# Patient Record
Sex: Female | Born: 1970 | Race: White | Hispanic: No | Marital: Married | Smoking: Never smoker
Health system: Southern US, Community
[De-identification: ages and names within clinical notes are randomized; demographics above are authoritative.]

## PROBLEM LIST (undated history)

## (undated) DIAGNOSIS — R87629 Unspecified abnormal cytological findings in specimens from vagina: Secondary | ICD-10-CM

## (undated) DIAGNOSIS — K219 Gastro-esophageal reflux disease without esophagitis: Secondary | ICD-10-CM

## (undated) DIAGNOSIS — E079 Disorder of thyroid, unspecified: Secondary | ICD-10-CM

## (undated) DIAGNOSIS — T7840XA Allergy, unspecified, initial encounter: Secondary | ICD-10-CM

## (undated) DIAGNOSIS — E7800 Pure hypercholesterolemia, unspecified: Secondary | ICD-10-CM

## (undated) DIAGNOSIS — F419 Anxiety disorder, unspecified: Secondary | ICD-10-CM

## (undated) DIAGNOSIS — E559 Vitamin D deficiency, unspecified: Secondary | ICD-10-CM

## (undated) HISTORY — DX: Allergy, unspecified, initial encounter: T78.40XA

## (undated) HISTORY — DX: Disorder of thyroid, unspecified: E07.9

## (undated) HISTORY — DX: Gastro-esophageal reflux disease without esophagitis: K21.9

## (undated) HISTORY — DX: Unspecified abnormal cytological findings in specimens from vagina: R87.629

## (undated) HISTORY — DX: Pure hypercholesterolemia, unspecified: E78.00

## (undated) HISTORY — DX: Vitamin D deficiency, unspecified: E55.9

## (undated) HISTORY — DX: Anxiety disorder, unspecified: F41.9

## (undated) HISTORY — PX: WISDOM TOOTH EXTRACTION: SHX21

---

## 1998-10-11 DIAGNOSIS — R87629 Unspecified abnormal cytological findings in specimens from vagina: Secondary | ICD-10-CM

## 1998-10-11 HISTORY — DX: Unspecified abnormal cytological findings in specimens from vagina: R87.629

## 2015-03-26 ENCOUNTER — Other Ambulatory Visit: Payer: Self-pay | Admitting: Obstetrics and Gynecology

## 2015-03-26 MED ORDER — NORGESTREL-ETHINYL ESTRADIOL 0.3-30 MG-MCG PO TABS: 1.0000 | ORAL_TABLET | Freq: Every day | ORAL | Status: DC

## 2015-03-26 NOTE — Telephone Encounter (Signed)
Please let her know a refill was sent in.

## 2015-03-26 NOTE — Telephone Encounter (Signed)
Notified pt rx sent in 

## 2015-03-27 NOTE — Telephone Encounter (Signed)
LM for pt letting her know. Stephanie Edwards

## 2015-04-16 ENCOUNTER — Encounter: Payer: Self-pay | Admitting: *Deleted

## 2015-04-17 ENCOUNTER — Encounter: Payer: Self-pay | Admitting: Obstetrics and Gynecology

## 2015-05-05 ENCOUNTER — Other Ambulatory Visit: Payer: Self-pay | Admitting: Obstetrics and Gynecology

## 2015-05-08 ENCOUNTER — Other Ambulatory Visit: Payer: Self-pay | Admitting: *Deleted

## 2015-05-08 ENCOUNTER — Ambulatory Visit (INDEPENDENT_AMBULATORY_CARE_PROVIDER_SITE_OTHER): Payer: BLUE CROSS/BLUE SHIELD | Admitting: Obstetrics and Gynecology

## 2015-05-08 ENCOUNTER — Encounter: Payer: Self-pay | Admitting: Obstetrics and Gynecology

## 2015-05-08 VITALS — BP 119/68 | HR 71 | Ht 67.0 in | Wt 182.6 lb

## 2015-05-08 DIAGNOSIS — E559 Vitamin D deficiency, unspecified: Secondary | ICD-10-CM

## 2015-05-08 DIAGNOSIS — E663 Overweight: Secondary | ICD-10-CM

## 2015-05-08 DIAGNOSIS — Z01419 Encounter for gynecological examination (general) (routine) without abnormal findings: Secondary | ICD-10-CM | POA: Diagnosis not present

## 2015-05-08 MED ORDER — NORGESTREL-ETHINYL ESTRADIOL 0.3-30 MG-MCG PO TABS: 1.0000 | ORAL_TABLET | Freq: Every day | ORAL | Status: DC

## 2015-05-08 MED ORDER — LEVOCETIRIZINE DIHYDROCHLORIDE 5 MG PO TABS: 5.0000 mg | ORAL_TABLET | Freq: Every day | ORAL | Status: DC

## 2015-05-08 NOTE — Progress Notes (Signed)
  Subjective:     Stephanie Edwards is a 44 y.o. female and is here for a comprehensive physical exam. The patient reports no problems.  History   Social History  . Marital Status: Married    Spouse Name: N/A  . Number of Children: N/A  . Years of Education: N/A   Occupational History  . Not on file.   Social History Main Topics  . Smoking status: Never Smoker   . Smokeless tobacco: Never Used  . Alcohol Use: Yes     Comment: occas  . Drug Use: No  . Sexual Activity: Yes    Birth Control/ Protection: Pill   Other Topics Concern  . Not on file   Social History Narrative   Health Maintenance  Topic Date Due  . HIV Screening  02/26/1987  . PAP SMEAR  02/25/1990  . TETANUS/TDAP  02/26/1991  . INFLUENZA VACCINE  05/12/2015    The following portions of the patient's history were reviewed and updated as appropriate: allergies, current medications, past family history, past medical history, past social history, past surgical history and problem list.  Review of Systems A comprehensive review of systems was negative.   Objective:    General appearance: alert, cooperative and appears stated age Neck: no adenopathy, no carotid bruit, no JVD, supple, symmetrical, trachea midline and thyroid not enlarged, symmetric, no tenderness/mass/nodules Lungs: clear to auscultation bilaterally Breasts: normal appearance, no masses or tenderness Heart: regular rate and rhythm, S1, S2 normal, no murmur, click, rub or gallop Abdomen: soft, non-tender; bowel sounds normal; no masses,  no organomegaly Pelvic: cervix normal in appearance, external genitalia normal, no adnexal masses or tenderness, no cervical motion tenderness, rectovaginal septum normal, uterus normal size, shape, and consistency and vagina normal without discharge    Assessment:    Healthy female exam. OCP user; overweight      Plan:  Routine screening labs(to be done at work-order given); Routine MMG ordered Refills for  OCP and Xyzal given  RTC 1 year See After Visit Summary for Counseling Recommendations

## 2015-05-08 NOTE — Patient Instructions (Signed)
Thank you for enrolling in MyChart. Please follow the instructions below to securely access your online medical record. MyChart allows you to send messages to your doctor, view your test results, renew your prescriptions, schedule appointments, and more.  How Do I Sign Up? 1. In your Internet browser, go to [email or web address] 2. Click on the New  User? link in the Sign In box.  3. Enter your MyChart Access Code exactly as it appears below. You will not need to use this code after you have completed the sign-up process. If you do not sign up before the expiration date, you must request a new code. MyChart Access Code: [email or web address] Expires: 07/07/2015  9:29 AM  4. Enter the last four digits of your Social Security Number (xxxx) and Date of Birth (mm/dd/yyyy) as indicated and click Next. You will be taken to the next sign-up page. 5. Create a MyChart ID. This will be your MyChart login ID and cannot be changed, so think of one that is secure and easy to remember. 6. Create a MyChart password. You can change your password at any time. 7. Enter your Password Reset Question and Answer and click Next. This can be used at a later time if you forget your password.  8. Select your communication preference, and if applicable enter your e-mail address. You will receive e-mail notification when new information is available in MyChart by choosing to receive e-mail notifications and filling in your e-mail. 9. Click Sign In. You can now view your medical record.   Additional Information If you have questions, you can email [email or web address] or call [phone] to talk to our MyChart staff. Remember, MyChart is NOT to be used for urgent needs. For medical emergencies, dial 911.

## 2015-05-16 ENCOUNTER — Telehealth: Payer: Self-pay | Admitting: *Deleted

## 2015-05-16 ENCOUNTER — Other Ambulatory Visit: Payer: Self-pay | Admitting: Obstetrics and Gynecology

## 2015-05-16 ENCOUNTER — Ambulatory Visit
Admission: RE | Admit: 2015-05-16 | Discharge: 2015-05-16 | Disposition: A | Discharge status: Home / Self Care | Payer: BLUE CROSS/BLUE SHIELD | Source: Ambulatory Visit | Attending: Obstetrics and Gynecology | Admitting: Obstetrics and Gynecology

## 2015-05-16 DIAGNOSIS — Z01419 Encounter for gynecological examination (general) (routine) without abnormal findings: Secondary | ICD-10-CM | POA: Insufficient documentation

## 2015-05-16 DIAGNOSIS — Z1231 Encounter for screening mammogram for malignant neoplasm of breast: Secondary | ICD-10-CM | POA: Insufficient documentation

## 2015-05-16 NOTE — Telephone Encounter (Signed)
-----   Message from Ulyses Amor, [place] sent at 05/16/2015  9:57 AM EDT ----- Please let her know I have reviewed her MMG and it is normal, though she does have increased density throughout both breast.

## 2015-05-19 ENCOUNTER — Encounter: Payer: Self-pay | Admitting: Obstetrics and Gynecology

## 2015-05-20 ENCOUNTER — Other Ambulatory Visit: Payer: Self-pay | Admitting: Obstetrics and Gynecology

## 2015-05-20 DIAGNOSIS — R7989 Other specified abnormal findings of blood chemistry: Secondary | ICD-10-CM

## 2015-05-20 DIAGNOSIS — E559 Vitamin D deficiency, unspecified: Secondary | ICD-10-CM

## 2015-05-20 MED ORDER — VITAMIN D (ERGOCALCIFEROL) 1.25 MG (50000 UNIT) PO CAPS: 50000.0000 [IU] | ORAL_CAPSULE | ORAL | Status: DC

## 2015-06-06 ENCOUNTER — Telehealth: Payer: Self-pay | Admitting: *Deleted

## 2015-06-06 NOTE — Telephone Encounter (Signed)
-----   Message from Ulyses Amor, [place] sent at 05/20/2015  3:56 PM EDT ----- Please let her know i have reviewed her labs, and her vit D is too low- needs to be on weekly supplements- and i sent in Rx and pleas mail info on vi D def,   Also thyroid is not functioning well, and I want her to be evaluated by endocrinologist- I put order in

## 2015-06-06 NOTE — Telephone Encounter (Signed)
Notified pt of lab results, mailed info on Vit D Needs her referral done-thanks

## 2015-06-10 NOTE — Telephone Encounter (Signed)
Referral was already done. I called [facility] to check the status, they stated they have been unable to reach the patient by phone and mailed her a letter to contact there office. I also called patient and left message.

## 2015-08-26 ENCOUNTER — Other Ambulatory Visit: Payer: Self-pay | Admitting: Obstetrics and Gynecology

## 2015-11-25 ENCOUNTER — Other Ambulatory Visit: Payer: Self-pay | Admitting: Obstetrics and Gynecology

## 2016-05-05 ENCOUNTER — Other Ambulatory Visit: Payer: Self-pay | Admitting: Obstetrics and Gynecology

## 2016-05-05 MED ORDER — LEVOCETIRIZINE DIHYDROCHLORIDE 5 MG PO TABS: 5.0000 mg | ORAL_TABLET | Freq: Every day | ORAL | 0 refills | Status: DC

## 2016-05-05 MED ORDER — NORGESTREL-ETHINYL ESTRADIOL 0.3-30 MG-MCG PO TABS: 1.0000 | ORAL_TABLET | Freq: Every day | ORAL | 0 refills | Status: DC

## 2016-05-11 ENCOUNTER — Other Ambulatory Visit: Payer: Self-pay | Admitting: Obstetrics and Gynecology

## 2016-05-12 ENCOUNTER — Encounter: Payer: Self-pay | Admitting: Obstetrics and Gynecology

## 2016-05-12 ENCOUNTER — Ambulatory Visit (INDEPENDENT_AMBULATORY_CARE_PROVIDER_SITE_OTHER): Payer: BLUE CROSS/BLUE SHIELD | Admitting: Obstetrics and Gynecology

## 2016-05-12 ENCOUNTER — Encounter: Payer: BLUE CROSS/BLUE SHIELD | Admitting: Obstetrics and Gynecology

## 2016-05-12 VITALS — BP 104/62 | HR 71 | Ht 67.0 in | Wt 192.8 lb

## 2016-05-12 DIAGNOSIS — E559 Vitamin D deficiency, unspecified: Secondary | ICD-10-CM | POA: Diagnosis not present

## 2016-05-12 DIAGNOSIS — Z01419 Encounter for gynecological examination (general) (routine) without abnormal findings: Secondary | ICD-10-CM | POA: Diagnosis not present

## 2016-05-12 NOTE — Progress Notes (Signed)
Subjective:   Stephanie Edwards is a 45 y.o. G0P0000 Caucasian female here for a routine well-woman exam.  No LMP recorded.    Current complaints: weight gain (last year 183#), walking 3 days a week  PCP: me       does desire labs  Social History: Sexual: heterosexual Marital Status: married Living situation: with family Occupation: [employer] at [employer] abroad Tobacco/alcohol: no tobacco use Illicit drugs: no history of illicit drug use  The following portions of the patient's history were reviewed and updated as appropriate: allergies, current medications, past family history, past medical history, past social history, past surgical history and problem list.  Past Medical History Past Medical History:  Diagnosis Date  . High cholesterol   . Vaginal Pap smear, abnormal 2000  . Vitamin D deficiency     Past Surgical History History reviewed. No pertinent surgical history.  Gynecologic History G0P0000  No LMP recorded. Contraception: IUD Last Pap: 2016. Results were: normal Last mammogram: 2016. Results were: normal   Obstetric History OB History  Gravida Para Term Preterm AB Living  0 0 0 0 0 0  SAB TAB Ectopic Multiple Live Births  0 0 0 0 0        Current Medications Current Outpatient Prescriptions on File Prior to Visit  Medication Sig Dispense Refill  . levocetirizine (XYZAL) 5 MG tablet Take 1 tablet (5 mg total) by mouth daily. 90 tablet 4  . norgestrel-ethinyl estradiol (LOW-OGESTREL) 0.3-30 MG-MCG tablet Take 1 tablet by mouth daily. 3 Package 0  . Vitamin D, Ergocalciferol, (DRISDOL) 50000 UNITS CAPS capsule Take 1 capsule (50,000 Units total) by mouth every 7 (seven) days. 30 capsule 1   No current facility-administered medications on file prior to visit.     Review of Systems Patient denies any headaches, blurred vision, shortness of breath, chest pain, abdominal pain, problems with bowel movements, urination, or intercourse.  Objective:  BP 104/62   Pulse  71   Ht 5\' 7"  (1.702 m)   Wt 192 lb 12.8 oz (87.5 kg)   BMI 30.20 kg/m  Physical Exam  General:  Well developed, well nourished, no acute distress. She is alert and oriented x3. Skin:  Warm and dry Neck:  Midline trachea, no thyromegaly or nodules Cardiovascular: Regular rate and rhythm, no murmur heard Lungs:  Effort normal, all lung fields clear to auscultation bilaterally Breasts:  No dominant palpable mass, retraction, or nipple discharge Abdomen:  Soft, non tender, no hepatosplenomegaly or masses Pelvic:  External genitalia is normal in appearance.  The vagina is normal in appearance. The cervix is bulbous, no CMT.  Thin prep pap is not done. Uterus is felt to be normal size, shape, and contour.  No adnexal masses or tenderness noted. Extremities:  No swelling or varicosities noted Psych:  She has a normal mood and affect  Assessment:   Healthy well-woman exam Overweight Vit D deficiency H/o abnormal thyroid levels BC counseling  Plan:  Labs repeated Considering switching to [facility] F/U 1 year for AE Mammogram ordered Shamel Galyean Suzan Nailer, CNM

## 2016-05-12 NOTE — Patient Instructions (Signed)
Preventive Care for Adults, Female A healthy lifestyle and preventive care can promote health and wellness. Preventive health guidelines for women include the following key practices.  A routine yearly physical is a good way to check with your health care provider about your health and preventive screening. It is a chance to share any concerns and updates on your health and to receive a thorough exam.  Visit your dentist for a routine exam and preventive care every 6 months. Brush your teeth twice a day and floss once a day. Good oral hygiene prevents tooth decay and gum disease.  The frequency of eye exams is based on your age, health, family medical history, use of contact lenses, and other factors. Follow your health care provider's recommendations for frequency of eye exams.  Eat a healthy diet. Foods like vegetables, fruits, whole grains, low-fat dairy products, and lean protein foods contain the nutrients you need without too many calories. Decrease your intake of foods high in solid fats, added sugars, and salt. Eat the right amount of calories for you.Get information about a proper diet from your health care provider, if necessary.  Regular physical exercise is one of the most important things you can do for your health. Most adults should get at least 150 minutes of moderate-intensity exercise (any activity that increases your heart rate and causes you to sweat) each week. In addition, most adults need muscle-strengthening exercises on 2 or more days a week.  Maintain a healthy weight. The body mass index (BMI) is a screening tool to identify possible weight problems. It provides an estimate of body fat based on height and weight. Your health care provider can find your BMI and can help you achieve or maintain a healthy weight.For adults 20 years and older:  A BMI below 18.5 is considered underweight.  A BMI of 18.5 to 24.9 is normal.  A BMI of 25 to 29.9 is considered  overweight.  A BMI of 30 and above is considered obese.  Maintain normal blood lipids and cholesterol levels by exercising and minimizing your intake of saturated fat. Eat a balanced diet with plenty of fruit and vegetables. Blood tests for lipids and cholesterol should begin at age 64 and be repeated every 5 years. If your lipid or cholesterol levels are high, you are over 50, or you are at high risk for heart disease, you may need your cholesterol levels checked more frequently.Ongoing high lipid and cholesterol levels should be treated with medicines if diet and exercise are not working.  If you smoke, find out from your health care provider how to quit. If you do not use tobacco, do not start.  Lung cancer screening is recommended for adults aged 52-80 years who are at high risk for developing lung cancer because of a history of smoking. A yearly low-dose CT scan of the lungs is recommended for people who have at least a 30-pack-year history of smoking and are a current smoker or have quit within the past 15 years. A pack year of smoking is smoking an average of 1 pack of cigarettes a day for 1 year (for example: 1 pack a day for 30 years or 2 packs a day for 15 years). Yearly screening should continue until the smoker has stopped smoking for at least 15 years. Yearly screening should be stopped for people who develop a health problem that would prevent them from having lung cancer treatment.  If you are pregnant, do not drink alcohol. If you are  breastfeeding, be very cautious about drinking alcohol. If you are not pregnant and choose to drink alcohol, do not have more than 1 drink per day. One drink is considered to be 12 ounces (355 mL) of beer, 5 ounces (148 mL) of wine, or 1.5 ounces (44 mL) of liquor.  Avoid use of street drugs. Do not share needles with anyone. Ask for help if you need support or instructions about stopping the use of drugs.  High blood pressure causes heart disease and  increases the risk of stroke. Your blood pressure should be checked at least every 1 to 2 years. Ongoing high blood pressure should be treated with medicines if weight loss and exercise do not work.  If you are 25-78 years old, ask your health care provider if you should take aspirin to prevent strokes.  Diabetes screening is done by taking a blood sample to check your blood glucose level after you have not eaten for a certain period of time (fasting). If you are not overweight and you do not have risk factors for diabetes, you should be screened once every 3 years starting at age 86. If you are overweight or obese and you are 3-87 years of age, you should be screened for diabetes every year as part of your cardiovascular risk assessment.  Breast cancer screening is essential preventive care for women. You should practice "breast self-awareness." This means understanding the normal appearance and feel of your breasts and may include breast self-examination. Any changes detected, no matter how small, should be reported to a health care provider. Women in their 66s and 30s should have a clinical breast exam (CBE) by a health care provider as part of a regular health exam every 1 to 3 years. After age 43, women should have a CBE every year. Starting at age 37, women should consider having a mammogram (breast X-ray test) every year. Women who have a family history of breast cancer should talk to their health care provider about genetic screening. Women at a high risk of breast cancer should talk to their health care providers about having an MRI and a mammogram every year.  Breast cancer gene (BRCA)-related cancer risk assessment is recommended for women who have family members with BRCA-related cancers. BRCA-related cancers include breast, ovarian, tubal, and peritoneal cancers. Having family members with these cancers may be associated with an increased risk for harmful changes (mutations) in the breast  cancer genes BRCA1 and BRCA2. Results of the assessment will determine the need for genetic counseling and BRCA1 and BRCA2 testing.  Your health care provider may recommend that you be screened regularly for cancer of the pelvic organs (ovaries, uterus, and vagina). This screening involves a pelvic examination, including checking for microscopic changes to the surface of your cervix (Pap test). You may be encouraged to have this screening done every 3 years, beginning at age 78.  For women ages 79-65, health care providers may recommend pelvic exams and Pap testing every 3 years, or they may recommend the Pap and pelvic exam, combined with testing for human papilloma virus (HPV), every 5 years. Some types of HPV increase your risk of cervical cancer. Testing for HPV may also be done on women of any age with unclear Pap test results.  Other health care providers may not recommend any screening for nonpregnant women who are considered low risk for pelvic cancer and who do not have symptoms. Ask your health care provider if a screening pelvic exam is right for  you.  If you have had past treatment for cervical cancer or a condition that could lead to cancer, you need Pap tests and screening for cancer for at least 20 years after your treatment. If Pap tests have been discontinued, your risk factors (such as having a new sexual partner) need to be reassessed to determine if screening should resume. Some women have medical problems that increase the chance of getting cervical cancer. In these cases, your health care provider may recommend more frequent screening and Pap tests.  Colorectal cancer can be detected and often prevented. Most routine colorectal cancer screening begins at the age of 50 years and continues through age 75 years. However, your health care provider may recommend screening at an earlier age if you have risk factors for colon cancer. On a yearly basis, your health care provider may provide  home test kits to check for hidden blood in the stool. Use of a small camera at the end of a tube, to directly examine the colon (sigmoidoscopy or colonoscopy), can detect the earliest forms of colorectal cancer. Talk to your health care provider about this at age 50, when routine screening begins. Direct exam of the colon should be repeated every 5-10 years through age 75 years, unless early forms of precancerous polyps or small growths are found.  People who are at an increased risk for hepatitis B should be screened for this virus. You are considered at high risk for hepatitis B if:  You were born in a country where hepatitis B occurs often. Talk with your health care provider about which countries are considered high risk.  Your parents were born in a high-risk country and you have not received a shot to protect against hepatitis B (hepatitis B vaccine).  You have HIV or AIDS.  You use needles to inject street drugs.  You live with, or have sex with, someone who has hepatitis B.  You get hemodialysis treatment.  You take certain medicines for conditions like cancer, organ transplantation, and autoimmune conditions.  Hepatitis C blood testing is recommended for all people born from 1945 through 1965 and any individual with known risks for hepatitis C.  Practice safe sex. Use condoms and avoid high-risk sexual practices to reduce the spread of sexually transmitted infections (STIs). STIs include gonorrhea, chlamydia, syphilis, trichomonas, herpes, HPV, and human immunodeficiency virus (HIV). Herpes, HIV, and HPV are viral illnesses that have no cure. They can result in disability, cancer, and death.  You should be screened for sexually transmitted illnesses (STIs) including gonorrhea and chlamydia if:  You are sexually active and are younger than 24 years.  You are older than 24 years and your health care provider tells you that you are at risk for this type of infection.  Your sexual  activity has changed since you were last screened and you are at an increased risk for chlamydia or gonorrhea. Ask your health care provider if you are at risk.  If you are at risk of being infected with HIV, it is recommended that you take a prescription medicine daily to prevent HIV infection. This is called preexposure prophylaxis (PrEP). You are considered at risk if:  You are sexually active and do not regularly use condoms or know the HIV status of your partner(s).  You take drugs by injection.  You are sexually active with a partner who has HIV.  Talk with your health care provider about whether you are at high risk of being infected with HIV. If   you choose to begin PrEP, you should first be tested for HIV. You should then be tested every 3 months for as long as you are taking PrEP.  Osteoporosis is a disease in which the bones lose minerals and strength with aging. This can result in serious bone fractures or breaks. The risk of osteoporosis can be identified using a bone density scan. Women ages 1 years and over and women at risk for fractures or osteoporosis should discuss screening with their health care providers. Ask your health care provider whether you should take a calcium supplement or vitamin D to reduce the rate of osteoporosis.  Menopause can be associated with physical symptoms and risks. Hormone replacement therapy is available to decrease symptoms and risks. You should talk to your health care provider about whether hormone replacement therapy is right for you.  Use sunscreen. Apply sunscreen liberally and repeatedly throughout the day. You should seek shade when your shadow is shorter than you. Protect yourself by wearing long sleeves, pants, a wide-brimmed hat, and sunglasses year round, whenever you are outdoors.  Once a month, do a whole body skin exam, using a mirror to look at the skin on your back. Tell your health care provider of new moles, moles that have irregular  borders, moles that are larger than a pencil eraser, or moles that have changed in shape or color.  Stay current with required vaccines (immunizations).  Influenza vaccine. All adults should be immunized every year.  Tetanus, diphtheria, and acellular pertussis (Td, Tdap) vaccine. Pregnant women should receive 1 dose of Tdap vaccine during each pregnancy. The dose should be obtained regardless of the length of time since the last dose. Immunization is preferred during the 27th-36th week of gestation. An adult who has not previously received Tdap or who does not know her vaccine status should receive 1 dose of Tdap. This initial dose should be followed by tetanus and diphtheria toxoids (Td) booster doses every 10 years. Adults with an unknown or incomplete history of completing a 3-dose immunization series with Td-containing vaccines should begin or complete a primary immunization series including a Tdap dose. Adults should receive a Td booster every 10 years.  Varicella vaccine. An adult without evidence of immunity to varicella should receive 2 doses or a second dose if she has previously received 1 dose. Pregnant females who do not have evidence of immunity should receive the first dose after pregnancy. This first dose should be obtained before leaving the health care facility. The second dose should be obtained 4-8 weeks after the first dose.  Human papillomavirus (HPV) vaccine. Females aged 13-26 years who have not received the vaccine previously should obtain the 3-dose series. The vaccine is not recommended for use in pregnant females. However, pregnancy testing is not needed before receiving a dose. If a female is found to be pregnant after receiving a dose, no treatment is needed. In that case, the remaining doses should be delayed until after the pregnancy. Immunization is recommended for any person with an immunocompromised condition through the age of 24 years if she did not get any or all doses  earlier. During the 3-dose series, the second dose should be obtained 4-8 weeks after the first dose. The third dose should be obtained 24 weeks after the first dose and 16 weeks after the second dose.  Zoster vaccine. One dose is recommended for adults aged 97 years or older unless certain conditions are present.  Measles, mumps, and rubella (MMR) vaccine. Adults born  before 1957 generally are considered immune to measles and mumps. Adults born in [birth year] or later should have 1 or more doses of MMR vaccine unless there is a contraindication to the vaccine or there is laboratory evidence of immunity to each of the three diseases. A routine second dose of MMR vaccine should be obtained at least 28 days after the first dose for students attending postsecondary schools, health care workers, or international travelers. People who received inactivated measles vaccine or an unknown type of measles vaccine during 1963-1967 should receive 2 doses of MMR vaccine. People who received inactivated mumps vaccine or an unknown type of mumps vaccine before 1979 and are at high risk for mumps infection should consider immunization with 2 doses of MMR vaccine. For females of childbearing age, rubella immunity should be determined. If there is no evidence of immunity, females who are not pregnant should be vaccinated. If there is no evidence of immunity, females who are pregnant should delay immunization until after pregnancy. Unvaccinated health care workers born before 60 who lack laboratory evidence of measles, mumps, or rubella immunity or laboratory confirmation of disease should consider measles and mumps immunization with 2 doses of MMR vaccine or rubella immunization with 1 dose of MMR vaccine.  Pneumococcal 13-valent conjugate (PCV13) vaccine. When indicated, a person who is uncertain of his immunization history and has no record of immunization should receive the PCV13 vaccine. All adults 61 years of age and older  should receive this vaccine. An adult aged 92 years or older who has certain medical conditions and has not been previously immunized should receive 1 dose of PCV13 vaccine. This PCV13 should be followed with a dose of pneumococcal polysaccharide (PPSV23) vaccine. Adults who are at high risk for pneumococcal disease should obtain the PPSV23 vaccine at least 8 weeks after the dose of PCV13 vaccine. Adults older than 44 years of age who have normal immune system function should obtain the PPSV23 vaccine dose at least 1 year after the dose of PCV13 vaccine.  Pneumococcal polysaccharide (PPSV23) vaccine. When PCV13 is also indicated, PCV13 should be obtained first. All adults aged 2 years and older should be immunized. An adult younger than age 30 years who has certain medical conditions should be immunized. Any person who resides in a nursing home or long-term care facility should be immunized. An adult smoker should be immunized. People with an immunocompromised condition and certain other conditions should receive both PCV13 and PPSV23 vaccines. People with human immunodeficiency virus (HIV) infection should be immunized as soon as possible after diagnosis. Immunization during chemotherapy or radiation therapy should be avoided. Routine use of PPSV23 vaccine is not recommended for American Indians, [place] Natives, or people younger than 65 years unless there are medical conditions that require PPSV23 vaccine. When indicated, people who have unknown immunization and have no record of immunization should receive PPSV23 vaccine. One-time revaccination 5 years after the first dose of PPSV23 is recommended for people aged 19-64 years who have chronic kidney failure, nephrotic syndrome, asplenia, or immunocompromised conditions. People who received 1-2 doses of PPSV23 before age 44 years should receive another dose of PPSV23 vaccine at age 83 years or later if at least 5 years have passed since the previous dose. Doses  of PPSV23 are not needed for people immunized with PPSV23 at or after age 20 years.  Meningococcal vaccine. Adults with asplenia or persistent complement component deficiencies should receive 2 doses of quadrivalent meningococcal conjugate (MenACWY-D) vaccine. The doses should be obtained  at least 2 months apart. Microbiologists working with certain meningococcal bacteria, [place] recruits, people at risk during an outbreak, and people who travel to or live in countries with a high rate of meningitis should be immunized. A first-year college student up through age 28 years who is living in a residence hall should receive a dose if she did not receive a dose on or after her 16th birthday. Adults who have certain high-risk conditions should receive one or more doses of vaccine.  Hepatitis A vaccine. Adults who wish to be protected from this disease, have certain high-risk conditions, work with hepatitis A-infected animals, work in hepatitis A research labs, or travel to or work in countries with a high rate of hepatitis A should be immunized. Adults who were previously unvaccinated and who anticipate close contact with an international adoptee during the first 60 days after arrival in the [place] from a country with a high rate of hepatitis A should be immunized.  Hepatitis B vaccine. Adults who wish to be protected from this disease, have certain high-risk conditions, may be exposed to blood or other infectious body fluids, are household contacts or sex partners of hepatitis B positive people, are clients or workers in certain care facilities, or travel to or work in countries with a high rate of hepatitis B should be immunized.  Haemophilus influenzae type b (Hib) vaccine. A previously unvaccinated person with asplenia or sickle cell disease or having a scheduled splenectomy should receive 1 dose of Hib vaccine. Regardless of previous immunization, a recipient of a hematopoietic stem cell transplant  should receive a 3-dose series 6-12 months after her successful transplant. Hib vaccine is not recommended for adults with HIV infection. Preventive Services / Frequency Ages 71 to 87 years  Blood pressure check.** / Every 3-5 years.  Lipid and cholesterol check.** / Every 5 years beginning at age 1.  Clinical breast exam.** / Every 3 years for women in their 3s and 31s.  BRCA-related cancer risk assessment.** / For women who have family members with a BRCA-related cancer (breast, ovarian, tubal, or peritoneal cancers).  Pap test.** / Every 2 years from ages 50 through 86. Every 3 years starting at age 87 through age 7 or 75 with a history of 3 consecutive normal Pap tests.  HPV screening.** / Every 3 years from ages 59 through ages 35 to 6 with a history of 3 consecutive normal Pap tests.  Hepatitis C blood test.** / For any individual with known risks for hepatitis C.  Skin self-exam. / Monthly.  Influenza vaccine. / Every year.  Tetanus, diphtheria, and acellular pertussis (Tdap, Td) vaccine.** / Consult your health care provider. Pregnant women should receive 1 dose of Tdap vaccine during each pregnancy. 1 dose of Td every 10 years.  Varicella vaccine.** / Consult your health care provider. Pregnant females who do not have evidence of immunity should receive the first dose after pregnancy.  HPV vaccine. / 3 doses over 6 months, if 72 and younger. The vaccine is not recommended for use in pregnant females. However, pregnancy testing is not needed before receiving a dose.  Measles, mumps, rubella (MMR) vaccine.** / You need at least 1 dose of MMR if you were born in 1957 or later. You may also need a 2nd dose. For females of childbearing age, rubella immunity should be determined. If there is no evidence of immunity, females who are not pregnant should be vaccinated. If there is no evidence of immunity, females who are  pregnant should delay immunization until after  pregnancy.  Pneumococcal 13-valent conjugate (PCV13) vaccine.** / Consult your health care provider.  Pneumococcal polysaccharide (PPSV23) vaccine.** / 1 to 2 doses if you smoke cigarettes or if you have certain conditions.  Meningococcal vaccine.** / 1 dose if you are age 87 to 44 years and a Market researcher living in a residence hall, or have one of several medical conditions, you need to get vaccinated against meningococcal disease. You may also need additional booster doses.  Hepatitis A vaccine.** / Consult your health care provider.  Hepatitis B vaccine.** / Consult your health care provider.  Haemophilus influenzae type b (Hib) vaccine.** / Consult your health care provider. Ages 86 to 38 years  Blood pressure check.** / Every year.  Lipid and cholesterol check.** / Every 5 years beginning at age 49 years.  Lung cancer screening. / Every year if you are aged 71-80 years and have a 30-pack-year history of smoking and currently smoke or have quit within the past 15 years. Yearly screening is stopped once you have quit smoking for at least 15 years or develop a health problem that would prevent you from having lung cancer treatment.  Clinical breast exam.** / Every year after age 51 years.  BRCA-related cancer risk assessment.** / For women who have family members with a BRCA-related cancer (breast, ovarian, tubal, or peritoneal cancers).  Mammogram.** / Every year beginning at age 18 years and continuing for as long as you are in good health. Consult with your health care provider.  Pap test.** / Every 3 years starting at age 63 years through age 37 or 57 years with a history of 3 consecutive normal Pap tests.  HPV screening.** / Every 3 years from ages 41 years through ages 76 to 23 years with a history of 3 consecutive normal Pap tests.  Fecal occult blood test (FOBT) of stool. / Every year beginning at age 36 years and continuing until age 51 years. You may not need  to do this test if you get a colonoscopy every 10 years.  Flexible sigmoidoscopy or colonoscopy.** / Every 5 years for a flexible sigmoidoscopy or every 10 years for a colonoscopy beginning at age 36 years and continuing until age 35 years.  Hepatitis C blood test.** / For all people born from [birth year] through 1965 and any individual with known risks for hepatitis C.  Skin self-exam. / Monthly.  Influenza vaccine. / Every year.  Tetanus, diphtheria, and acellular pertussis (Tdap/Td) vaccine.** / Consult your health care provider. Pregnant women should receive 1 dose of Tdap vaccine during each pregnancy. 1 dose of Td every 10 years.  Varicella vaccine.** / Consult your health care provider. Pregnant females who do not have evidence of immunity should receive the first dose after pregnancy.  Zoster vaccine.** / 1 dose for adults aged 73 years or older.  Measles, mumps, rubella (MMR) vaccine.** / You need at least 1 dose of MMR if you were born in 1957 or later. You may also need a second dose. For females of childbearing age, rubella immunity should be determined. If there is no evidence of immunity, females who are not pregnant should be vaccinated. If there is no evidence of immunity, females who are pregnant should delay immunization until after pregnancy.  Pneumococcal 13-valent conjugate (PCV13) vaccine.** / Consult your health care provider.  Pneumococcal polysaccharide (PPSV23) vaccine.** / 1 to 2 doses if you smoke cigarettes or if you have certain conditions.  Meningococcal vaccine.** /  Consult your health care provider.  Hepatitis A vaccine.** / Consult your health care provider.  Hepatitis B vaccine.** / Consult your health care provider.  Haemophilus influenzae type b (Hib) vaccine.** / Consult your health care provider. Ages 80 years and over  Blood pressure check.** / Every year.  Lipid and cholesterol check.** / Every 5 years beginning at age 62 years.  Lung cancer  screening. / Every year if you are aged 32-80 years and have a 30-pack-year history of smoking and currently smoke or have quit within the past 15 years. Yearly screening is stopped once you have quit smoking for at least 15 years or develop a health problem that would prevent you from having lung cancer treatment.  Clinical breast exam.** / Every year after age 61 years.  BRCA-related cancer risk assessment.** / For women who have family members with a BRCA-related cancer (breast, ovarian, tubal, or peritoneal cancers).  Mammogram.** / Every year beginning at age 39 years and continuing for as long as you are in good health. Consult with your health care provider.  Pap test.** / Every 3 years starting at age 85 years through age 74 or 72 years with 3 consecutive normal Pap tests. Testing can be stopped between 65 and 70 years with 3 consecutive normal Pap tests and no abnormal Pap or HPV tests in the past 10 years.  HPV screening.** / Every 3 years from ages 55 years through ages 67 or 77 years with a history of 3 consecutive normal Pap tests. Testing can be stopped between 65 and 70 years with 3 consecutive normal Pap tests and no abnormal Pap or HPV tests in the past 10 years.  Fecal occult blood test (FOBT) of stool. / Every year beginning at age 81 years and continuing until age 22 years. You may not need to do this test if you get a colonoscopy every 10 years.  Flexible sigmoidoscopy or colonoscopy.** / Every 5 years for a flexible sigmoidoscopy or every 10 years for a colonoscopy beginning at age 67 years and continuing until age 22 years.  Hepatitis C blood test.** / For all people born from [birth year] through 1964 and any individual with known risks for hepatitis C.  Osteoporosis screening.** / A one-time screening for women ages 8 years and over and women at risk for fractures or osteoporosis.  Skin self-exam. / Monthly.  Influenza vaccine. / Every year.  Tetanus, diphtheria, and  acellular pertussis (Tdap/Td) vaccine.** / 1 dose of Td every 10 years.  Varicella vaccine.** / Consult your health care provider.  Zoster vaccine.** / 1 dose for adults aged 56 years or older.  Pneumococcal 13-valent conjugate (PCV13) vaccine.** / Consult your health care provider.  Pneumococcal polysaccharide (PPSV23) vaccine.** / 1 dose for all adults aged 15 years and older.  Meningococcal vaccine.** / Consult your health care provider.  Hepatitis A vaccine.** / Consult your health care provider.  Hepatitis B vaccine.** / Consult your health care provider.  Haemophilus influenzae type b (Hib) vaccine.** / Consult your health care provider. ** Family history and personal history of risk and conditions may change your health care provider's recommendations.   This information is not intended to replace advice given to you by your health care provider. Make sure you discuss any questions you have with your health care provider.   Document Released: 11/23/2001 Document Revised: 10/18/2014 Document Reviewed: 02/22/2011 Elsevier Interactive Patient Education [employer].

## 2016-05-14 MED ORDER — NORGESTREL-ETHINYL ESTRADIOL 0.3-30 MG-MCG PO TABS: 1.0000 | ORAL_TABLET | Freq: Every day | ORAL | 0 refills | Status: DC

## 2016-05-14 MED ORDER — LEVOCETIRIZINE DIHYDROCHLORIDE 5 MG PO TABS: 5.0000 mg | ORAL_TABLET | Freq: Every day | ORAL | 0 refills | Status: DC

## 2016-05-21 ENCOUNTER — Other Ambulatory Visit: Payer: Self-pay | Admitting: Obstetrics and Gynecology

## 2016-05-21 ENCOUNTER — Ambulatory Visit
Admission: RE | Admit: 2016-05-21 | Discharge: 2016-05-21 | Disposition: A | Discharge status: Home / Self Care | Payer: BLUE CROSS/BLUE SHIELD | Source: Ambulatory Visit | Attending: Obstetrics and Gynecology | Admitting: Obstetrics and Gynecology

## 2016-05-21 DIAGNOSIS — Z1231 Encounter for screening mammogram for malignant neoplasm of breast: Secondary | ICD-10-CM | POA: Diagnosis not present

## 2016-05-21 DIAGNOSIS — Z01419 Encounter for gynecological examination (general) (routine) without abnormal findings: Secondary | ICD-10-CM | POA: Diagnosis not present

## 2016-05-27 ENCOUNTER — Encounter: Payer: Self-pay | Admitting: Obstetrics and Gynecology

## 2016-06-11 ENCOUNTER — Encounter: Payer: Self-pay | Admitting: Obstetrics and Gynecology

## 2016-06-11 ENCOUNTER — Other Ambulatory Visit: Payer: Self-pay | Admitting: Obstetrics and Gynecology

## 2016-06-11 DIAGNOSIS — E559 Vitamin D deficiency, unspecified: Secondary | ICD-10-CM | POA: Diagnosis not present

## 2016-06-11 DIAGNOSIS — Z01419 Encounter for gynecological examination (general) (routine) without abnormal findings: Secondary | ICD-10-CM | POA: Diagnosis not present

## 2016-06-12 LAB — COMPREHENSIVE METABOLIC PANEL
A/G RATIO: 2 (ref 1.2–2.2)
ALBUMIN: 4.4 g/dL (ref 3.5–5.5)
ALT: 12 IU/L (ref 0–32)
AST: 17 IU/L (ref 0–40)
Alkaline Phosphatase: 72 IU/L (ref 39–117)
BUN/Creatinine Ratio: 11 (ref 9–23)
BUN: 11 mg/dL (ref 6–24)
Bilirubin Total: 0.5 mg/dL (ref 0.0–1.2)
CO2: 24 mmol/L (ref 18–29)
Calcium: 9.3 mg/dL (ref 8.7–10.2)
Chloride: 100 mmol/L (ref 96–106)
Creatinine, Ser: 1 mg/dL (ref 0.57–1.00)
GFR calc non Af Amer: 69 mL/min/{1.73_m2} (ref >59)
GFR, EST AFRICAN AMERICAN: 79 mL/min/{1.73_m2} (ref >59)
GLOBULIN, TOTAL: 2.2 g/dL (ref 1.5–4.5)
Glucose: 91 mg/dL (ref 65–99)
POTASSIUM: 5.2 mmol/L (ref 3.5–5.2)
SODIUM: 138 mmol/L (ref 134–144)
TOTAL PROTEIN: 6.6 g/dL (ref 6.0–8.5)

## 2016-06-12 LAB — CBC
Hematocrit: 41.1 % (ref 34.0–46.6)
Hemoglobin: 14.1 g/dL (ref 11.1–15.9)
MCH: 31.3 pg (ref 26.6–33.0)
MCHC: 34.3 g/dL (ref 31.5–35.7)
MCV: 91 fL (ref 79–97)
PLATELETS: 237 10*3/uL (ref 150–379)
RBC: 4.5 x10E6/uL (ref 3.77–5.28)
RDW: 12.9 % (ref 12.3–15.4)
WBC: 5.2 10*3/uL (ref 3.4–10.8)

## 2016-06-12 LAB — LIPID PANEL W/O CHOL/HDL RATIO
CHOLESTEROL TOTAL: 230 mg/dL — AB (ref 100–199)
HDL: 57 mg/dL (ref >39)
LDL CALC: 156 mg/dL — AB (ref 0–99)
Triglycerides: 87 mg/dL (ref 0–149)
VLDL Cholesterol Cal: 17 mg/dL (ref 5–40)

## 2016-06-12 LAB — THYROID PANEL WITH TSH
Free Thyroxine Index: 2.3 (ref 1.2–4.9)
T3 Uptake Ratio: 24 % (ref 24–39)
T4 TOTAL: 9.4 ug/dL (ref 4.5–12.0)
TSH: 4.33 u[IU]/mL (ref 0.450–4.500)

## 2016-06-12 LAB — VITAMIN D 25 HYDROXY (VIT D DEFICIENCY, FRACTURES): VIT D 25 HYDROXY: 29.4 ng/mL — AB (ref 30.0–100.0)

## 2016-06-24 ENCOUNTER — Other Ambulatory Visit: Payer: Self-pay | Admitting: *Deleted

## 2016-06-24 MED ORDER — VITAMIN D (ERGOCALCIFEROL) 1.25 MG (50000 UNIT) PO CAPS: 50000.0000 [IU] | ORAL_CAPSULE | ORAL | 4 refills | Status: DC

## 2016-06-27 ENCOUNTER — Encounter: Payer: Self-pay | Admitting: Obstetrics and Gynecology

## 2016-07-16 ENCOUNTER — Encounter: Payer: Self-pay | Admitting: Obstetrics and Gynecology

## 2016-07-28 ENCOUNTER — Other Ambulatory Visit: Payer: Self-pay | Admitting: Obstetrics and Gynecology

## 2016-07-28 MED ORDER — MISOPROSTOL 200 MCG PO TABS: 200.0000 ug | ORAL_TABLET | Freq: Once | ORAL | 0 refills | Status: DC

## 2016-07-29 ENCOUNTER — Encounter: Payer: Self-pay | Admitting: Obstetrics and Gynecology

## 2016-07-29 ENCOUNTER — Ambulatory Visit (INDEPENDENT_AMBULATORY_CARE_PROVIDER_SITE_OTHER): Payer: BLUE CROSS/BLUE SHIELD | Admitting: Obstetrics and Gynecology

## 2016-07-29 VITALS — BP 116/75 | HR 83 | Ht 67.0 in | Wt 195.2 lb

## 2016-07-29 DIAGNOSIS — Z30430 Encounter for insertion of intrauterine contraceptive device: Secondary | ICD-10-CM | POA: Diagnosis not present

## 2016-07-29 MED ORDER — LEVONORGESTREL 19.5 MG IU IUD: 1.0000 | INTRAUTERINE_SYSTEM | Freq: Once | INTRAUTERINE | 0 refills | Status: DC

## 2016-07-29 NOTE — Progress Notes (Signed)
Stephanie Edwards is a 45 y.o. year old 1000P0000 Caucasian female who presents for placement of a Kyleena IUD.  Patient's last menstrual period was 07/01/2016. BP 116/75   Pulse 83   Ht 5\' 7"  (1.702 m)   Wt 195 lb 3.2 oz (88.5 kg)   LMP 07/01/2016   BMI 30.57 kg/m  Last sexual intercourse was last week, and pregnancy test today was negative  The risks and benefits of the method and placement have been thouroughly reviewed with the patient and all questions were answered.  Specifically the patient is aware of failure rate of 10/998, expulsion of the IUD and of possible perforation.  The patient is aware of irregular bleeding due to the method and understands the incidence of irregular bleeding diminishes with time.  Signed copy of informed consent in chart.   Time out was performed.  A pederson speculum was placed in the vagina.  The cervix was visualized, prepped using Betadine, and grasped with a single tooth tenaculum. The uterus was found to be retroflexed and it sounded to 7 cm.  Kyeena IUD placed per manufacturer's recommendations.   The strings were trimmed to 3 cm.  The patient was given post procedure instructions, including signs and symptoms of infection and to check for the strings after each menses or each month, and refraining from intercourse or anything in the vagina for 3 days.  She was given a [place] care card with date [place] placed, and date to be removed.    Melody Suzan NailerN Shambley, CNM

## 2016-07-29 NOTE — Patient Instructions (Signed)
IUD PLACEMENT POST-PROCEDURE INSTRUCTIONS  1. You may take Ibuprofen, Aleve or Tylenol for pain if needed.  Cramping should resolve within in 24 hours.  2. You may have a small amount of spotting.  You should wear a mini pad for the next few days.  3. You may have intercourse after 24 hours.  If you using this for birth control, it is effective immediately.  4. You need to call if you have any pelvic pain, fever, heavy bleeding or foul smelling vaginal discharge.  Irregular bleeding is common the first several months after having an IUD placed. You do not need to call for this reason unless you are concerned.  5. Shower or bathe as normal  6. You should have a follow-up appointment in 8 weeks for a re-check to make sure you are not having any problems.

## 2016-09-23 ENCOUNTER — Encounter: Payer: Self-pay | Admitting: Obstetrics and Gynecology

## 2016-09-23 ENCOUNTER — Ambulatory Visit (INDEPENDENT_AMBULATORY_CARE_PROVIDER_SITE_OTHER): Payer: BLUE CROSS/BLUE SHIELD | Admitting: Obstetrics and Gynecology

## 2016-09-23 VITALS — BP 106/64 | HR 68 | Wt 194.1 lb

## 2016-09-23 DIAGNOSIS — Z30431 Encounter for routine checking of intrauterine contraceptive device: Secondary | ICD-10-CM | POA: Diagnosis not present

## 2016-09-23 NOTE — Progress Notes (Signed)
Subjective:     Patient ID: Stephanie Edwards, female   DOB: 1971-07-24, 45 y.o.   MRN: [id number]  HPI Had IUD placed 6 weeks ago for menorrhagia, reports minimal spotting last week and after insertion, denies cramps of pain. Very happy with IUD.  Review of Systems negative    Objective:   Physical Exam A&Ox4 Well groomed female in no distress Blood pressure 106/64, pulse 68, weight 194 lb 1.6 oz (88 kg). Pelvic exam: normal external genitalia, vulva, vagina, cervix, uterus and adnexa, IUD string noted.    Assessment:     IUD check    Plan:     RTC at next AE or as needed.  Stephanie Edwards, CNM

## 2016-12-02 ENCOUNTER — Other Ambulatory Visit: Payer: Self-pay | Admitting: Obstetrics and Gynecology

## 2016-12-03 ENCOUNTER — Other Ambulatory Visit: Payer: Self-pay | Admitting: Obstetrics and Gynecology

## 2017-04-27 ENCOUNTER — Other Ambulatory Visit: Payer: Self-pay | Admitting: Obstetrics and Gynecology

## 2017-04-27 DIAGNOSIS — Z1231 Encounter for screening mammogram for malignant neoplasm of breast: Secondary | ICD-10-CM

## 2017-04-28 ENCOUNTER — Encounter: Payer: Self-pay | Admitting: Adult Health

## 2017-04-28 ENCOUNTER — Ambulatory Visit: Payer: Self-pay | Admitting: Adult Health

## 2017-04-28 VITALS — BP 112/78 | HR 76 | Temp 98.6°F

## 2017-04-28 DIAGNOSIS — H60501 Unspecified acute noninfective otitis externa, right ear: Secondary | ICD-10-CM

## 2017-04-28 DIAGNOSIS — H6591 Unspecified nonsuppurative otitis media, right ear: Secondary | ICD-10-CM

## 2017-04-28 DIAGNOSIS — H6123 Impacted cerumen, bilateral: Secondary | ICD-10-CM

## 2017-04-28 MED ORDER — NEOMYCIN-POLYMYXIN-HC 3.5-10000-1 OT SOLN: 4.0000 [drp] | Freq: Three times a day (TID) | OTIC | 0 refills | Status: DC

## 2017-04-28 NOTE — Patient Instructions (Signed)
Earwax Buildup, Adult The ears produce a substance called earwax that helps keep bacteria out of the ear and protects the skin in the ear canal. Occasionally, earwax can build up in the ear and cause discomfort or hearing loss. What increases the risk? This condition is more likely to develop in people who:  Are female.  Are elderly.  Naturally produce more earwax.  Clean their ears often with cotton swabs.  Use earplugs often.  Use in-ear headphones often.  Wear hearing aids.  Have narrow ear canals.  Have earwax that is overly thick or sticky.  Have eczema.  Are dehydrated.  Have excess hair in the ear canal.  What are the signs or symptoms? Symptoms of this condition include:  Reduced or muffled hearing.  A feeling of fullness in the ear or feeling that the ear is plugged.  Fluid coming from the ear.  Ear pain.  Ear itch.  Ringing in the ear.  Coughing.  An obvious piece of earwax that can be seen inside the ear canal.  How is this diagnosed? This condition may be diagnosed based on:  Your symptoms.  Your medical history.  An ear exam. During the exam, your health care provider will look into your ear with an instrument called an otoscope.  You may have tests, including a hearing test. How is this treated? This condition may be treated by:  Using ear drops to soften the earwax.  Having the earwax removed by a health care provider. The health care provider may: ? Flush the ear with water. ? Use an instrument that has a loop on the end (curette). ? Use a suction device.  Surgery to remove the wax buildup. This may be done in severe cases.  Follow these instructions at home:  Take over-the-counter and prescription medicines only as told by your health care provider.  Do not put any objects, including cotton swabs, into your ear. You can clean the opening of your ear canal with a washcloth or facial tissue.  Follow instructions from your health  care provider about cleaning your ears. Do not over-clean your ears.  Drink enough fluid to keep your urine clear or pale yellow. This will help to thin the earwax.  Keep all follow-up visits as told by your health care provider. If earwax builds up in your ears often or if you use hearing aids, consider seeing your health care provider for routine, preventive ear cleanings. Ask your health care provider how often you should schedule your cleanings.  If you have hearing aids, clean them according to instructions from the manufacturer and your health care provider. Contact a health care provider if:  You have ear pain.  You develop a fever.  You have blood, pus, or other fluid coming from your ear.  You have hearing loss.  You have ringing in your ears that does not go away.  Your symptoms do not improve with treatment.  You feel like the room is spinning (vertigo). Summary  Earwax can build up in the ear and cause discomfort or hearing loss.  The most common symptoms of this condition include reduced or muffled hearing and a feeling of fullness in the ear or feeling that the ear is plugged.  This condition may be diagnosed based on your symptoms, your medical history, and an ear exam.  This condition may be treated by using ear drops to soften the earwax or by having the earwax removed by a health care provider.  Do   not put any objects, including cotton swabs, into your ear. You can clean the opening of your ear canal with a washcloth or facial tissue. This information is not intended to replace advice given to you by your health care provider. Make sure you discuss any questions you have with your health care provider. Document Released: 11/04/2004 Document Revised: 12/08/2016 Document Reviewed: 12/08/2016 Elsevier Interactive Patient Education  2018 Elsevier Inc.  

## 2017-04-28 NOTE — Progress Notes (Signed)
Subjective:     Patient ID: Stephanie Edwards, female   DOB: 19-Mar-1971, 46 y.o.   MRN: [id number]  HPI  Patient is a 46 year old female who presents to the clinic in  no acute distress. She reports having a cold three weeks ago that has now resolved but states " I feel like my ears are so full" She denies any other symptoms. She denies any fevers, chills, nausea or vomiting. She denies nay hearing  Loss. She denies any pain.She denies any trauma.  No Known Allergies Social History   Social History  . Marital status: Married    Spouse name: N/A  . Number of children: N/A  . Years of education: N/A   Occupational History  . Not on file.   Social History Main Topics  . Smoking status: Never Smoker  . Smokeless tobacco: Never Used  . Alcohol use Yes     Comment: occas  . Drug use: No  . Sexual activity: Yes    Birth control/ protection: IUD     Comment: kyleena   Other Topics Concern  . Not on file   Social History Narrative  . No narrative on file   Past Medical History:  Diagnosis Date  . High cholesterol   . Vaginal Pap smear, abnormal 2000  . Vitamin D deficiency     Review of Systems  Constitutional: Negative for activity change, appetite change, chills, diaphoresis, fatigue, fever and unexpected weight change.  HENT: Negative for congestion, dental problem, drooling, ear discharge, ear pain, facial swelling, hearing loss, mouth sores, nosebleeds, postnasal drip, rhinorrhea, sinus pain, sinus pressure, sneezing, sore throat, tinnitus, trouble swallowing and voice change.   Eyes: Negative for photophobia, pain, discharge, redness and itching.  Respiratory: Negative for apnea, cough, choking, chest tightness, shortness of breath, wheezing and stridor.   Cardiovascular: Negative for chest pain and leg swelling.  Gastrointestinal: Negative.   Endocrine: Negative.   Genitourinary: Negative.   Musculoskeletal: Negative.   Skin: Negative.   Allergic/Immunologic: Negative.    Neurological: Negative.   Hematological: Negative.   Psychiatric/Behavioral: Negative.        Objective:   Physical Exam  Constitutional: She is oriented to person, place, and time. She appears well-developed and well-nourished. No distress.  HENT:  Head: Normocephalic and atraumatic.  Right Ear: Hearing normal. Tympanic membrane is not erythematous. A middle ear effusion is present.  Left Ear: Hearing and external ear normal. Tympanic membrane is not erythematous. A middle ear effusion is present.  Nose: Nose normal.  Mouth/Throat: Oropharynx is clear and moist. No oropharyngeal exudate.  Initial exam : right ear with  Moist cerumen covering canal and unable to visualize tympanic membrane   Left ear moderate cerumen able to partially visualize tympanic membrane   Patient would like bilateral ears irrigated for cerumen. Nicola GirtDebra Tobias performed in office.   Post bilateral ear irrigation able to visualize tympanic membranes fully and assessment under physical . Right ear canal with scattered erythema consistent with otitis externa. NO DRAINAGE.  Eyes: Pupils are equal, round, and reactive to light. Conjunctivae and EOM are normal.  Neck: Normal range of motion. Neck supple.  Cardiovascular: Normal rate and regular rhythm.   Pulmonary/Chest: Effort normal and breath sounds normal. No respiratory distress. She has no wheezes. She has no rales. She exhibits no tenderness.  Abdominal: Soft.  Musculoskeletal: Normal range of motion.  Neurological: She is alert and oriented to person, place, and time. She has normal reflexes.  Skin: Skin  is warm and dry. No rash noted. She is not diaphoretic. No erythema. No pallor.  Psychiatric: She has a normal mood and affect. Her behavior is normal. Judgment and thought content normal.      Assessment:     1. Cerumen Impaction 2. Right otitis externa 3. Mild ear effusion bilateral status post cold that has now resolved     Plan:     1. Make take  Sudafed over the counter as directed on package for 1 to 2 weeks as needed for ear effusion.   2. Right otitis externa : May take Motrin as needed per package instructions if any pain.  E-prescribed BELOW MEDICATION: Meds ordered this encounter  Medications  . neomycin-polymyxin-hydrocortisone (CORTISPORIN) OTIC solution    Sig: Place 4 drops into the right ear 3 (three) times daily.    Dispense:  10 mL    Refill:  0   3. Return to clinic at any time  if any new symptoms change, worsen or do not improve. Symptoms should improve  within 72 hours and if not improving you should call for an appointment at the clinic or bee seen in urgent care/ED if clinic is closed.  Patient verbalized above understanding of all instructions.

## 2017-05-18 ENCOUNTER — Encounter: Payer: BLUE CROSS/BLUE SHIELD | Admitting: Obstetrics and Gynecology

## 2017-05-23 ENCOUNTER — Ambulatory Visit
Admission: RE | Admit: 2017-05-23 | Discharge: 2017-05-23 | Disposition: A | Discharge status: Home / Self Care | Payer: BLUE CROSS/BLUE SHIELD | Source: Ambulatory Visit | Attending: Obstetrics and Gynecology | Admitting: Obstetrics and Gynecology

## 2017-05-23 ENCOUNTER — Telehealth: Payer: Self-pay | Admitting: Obstetrics and Gynecology

## 2017-05-23 DIAGNOSIS — Z1231 Encounter for screening mammogram for malignant neoplasm of breast: Secondary | ICD-10-CM | POA: Insufficient documentation

## 2017-05-23 NOTE — Telephone Encounter (Signed)
Patient didn't hear from you last week and wants to know about the RS and the labs.  Please call

## 2017-05-26 ENCOUNTER — Encounter: Payer: Self-pay | Admitting: Obstetrics and Gynecology

## 2017-05-26 ENCOUNTER — Other Ambulatory Visit: Payer: Self-pay | Admitting: *Deleted

## 2017-05-26 DIAGNOSIS — R5383 Other fatigue: Secondary | ICD-10-CM

## 2017-05-26 DIAGNOSIS — E559 Vitamin D deficiency, unspecified: Secondary | ICD-10-CM

## 2017-05-26 DIAGNOSIS — Z01419 Encounter for gynecological examination (general) (routine) without abnormal findings: Secondary | ICD-10-CM

## 2017-05-26 DIAGNOSIS — R635 Abnormal weight gain: Secondary | ICD-10-CM

## 2017-05-26 NOTE — Telephone Encounter (Signed)
Have spoken with pt through my chart

## 2017-06-04 ENCOUNTER — Encounter: Payer: Self-pay | Admitting: Obstetrics and Gynecology

## 2017-06-09 ENCOUNTER — Other Ambulatory Visit: Payer: Self-pay | Admitting: Obstetrics and Gynecology

## 2017-06-09 DIAGNOSIS — R5383 Other fatigue: Secondary | ICD-10-CM | POA: Diagnosis not present

## 2017-06-09 DIAGNOSIS — E559 Vitamin D deficiency, unspecified: Secondary | ICD-10-CM | POA: Diagnosis not present

## 2017-06-09 DIAGNOSIS — R635 Abnormal weight gain: Secondary | ICD-10-CM | POA: Diagnosis not present

## 2017-06-09 DIAGNOSIS — Z01419 Encounter for gynecological examination (general) (routine) without abnormal findings: Secondary | ICD-10-CM | POA: Diagnosis not present

## 2017-06-17 LAB — LIPID PANEL
CHOL/HDL RATIO: 4.3 ratio (ref 0.0–4.4)
CHOLESTEROL TOTAL: 220 mg/dL — AB (ref 100–199)
HDL: 51 mg/dL (ref >39)
LDL Calculated: 158 mg/dL — ABNORMAL HIGH (ref 0–99)
Triglycerides: 53 mg/dL (ref 0–149)
VLDL CHOLESTEROL CAL: 11 mg/dL (ref 5–40)

## 2017-06-17 LAB — COMPREHENSIVE METABOLIC PANEL
A/G RATIO: 2 (ref 1.2–2.2)
ALT: 13 IU/L (ref 0–32)
AST: 19 IU/L (ref 0–40)
Albumin: 4.3 g/dL (ref 3.5–5.5)
Alkaline Phosphatase: 77 IU/L (ref 39–117)
BUN/Creatinine Ratio: 9 (ref 9–23)
BUN: 8 mg/dL (ref 6–24)
Bilirubin Total: 0.5 mg/dL (ref 0.0–1.2)
CALCIUM: 8.9 mg/dL (ref 8.7–10.2)
CO2: 23 mmol/L (ref 20–29)
CREATININE: 0.87 mg/dL (ref 0.57–1.00)
Chloride: 101 mmol/L (ref 96–106)
GFR, EST AFRICAN AMERICAN: 93 mL/min/{1.73_m2} (ref >59)
GFR, EST NON AFRICAN AMERICAN: 81 mL/min/{1.73_m2} (ref >59)
GLOBULIN, TOTAL: 2.1 g/dL (ref 1.5–4.5)
Glucose: 88 mg/dL (ref 65–99)
POTASSIUM: 4.3 mmol/L (ref 3.5–5.2)
Sodium: 139 mmol/L (ref 134–144)
TOTAL PROTEIN: 6.4 g/dL (ref 6.0–8.5)

## 2017-06-17 LAB — THYROID PANEL WITH TSH
Free Thyroxine Index: 2.4 (ref 1.2–4.9)
T3 UPTAKE RATIO: 30 % (ref 24–39)
T4, Total: 8 ug/dL (ref 4.5–12.0)
TSH: 5.25 u[IU]/mL — AB (ref 0.450–4.500)

## 2017-06-17 LAB — CBC
Hematocrit: 39.5 % (ref 34.0–46.6)
Hemoglobin: 13.5 g/dL (ref 11.1–15.9)
MCH: 30.8 pg (ref 26.6–33.0)
MCHC: 34.2 g/dL (ref 31.5–35.7)
MCV: 90 fL (ref 79–97)
Platelets: 226 10*3/uL (ref 150–379)
RBC: 4.38 x10E6/uL (ref 3.77–5.28)
RDW: 13 % (ref 12.3–15.4)
WBC: 5.6 10*3/uL (ref 3.4–10.8)

## 2017-06-17 LAB — VITAMIN D 1,25 DIHYDROXY
VITAMIN D2 1, 25 (OH): 14 pg/mL
Vitamin D 1, 25 (OH)2 Total: 33 pg/mL
Vitamin D3 1, 25 (OH)2: 19 pg/mL

## 2017-06-21 ENCOUNTER — Other Ambulatory Visit: Payer: Self-pay | Admitting: Obstetrics and Gynecology

## 2017-06-21 DIAGNOSIS — E039 Hypothyroidism, unspecified: Secondary | ICD-10-CM | POA: Insufficient documentation

## 2017-06-21 DIAGNOSIS — R7989 Other specified abnormal findings of blood chemistry: Secondary | ICD-10-CM

## 2017-06-21 DIAGNOSIS — E785 Hyperlipidemia, unspecified: Secondary | ICD-10-CM

## 2017-06-27 ENCOUNTER — Encounter: Payer: Self-pay | Admitting: Obstetrics and Gynecology

## 2017-07-04 DIAGNOSIS — E782 Mixed hyperlipidemia: Secondary | ICD-10-CM | POA: Diagnosis not present

## 2017-07-04 DIAGNOSIS — E039 Hypothyroidism, unspecified: Secondary | ICD-10-CM | POA: Diagnosis not present

## 2017-07-21 ENCOUNTER — Other Ambulatory Visit: Payer: Self-pay | Admitting: Obstetrics and Gynecology

## 2017-07-21 DIAGNOSIS — Z01419 Encounter for gynecological examination (general) (routine) without abnormal findings: Secondary | ICD-10-CM | POA: Diagnosis not present

## 2017-07-22 ENCOUNTER — Ambulatory Visit (INDEPENDENT_AMBULATORY_CARE_PROVIDER_SITE_OTHER): Payer: BLUE CROSS/BLUE SHIELD | Admitting: Obstetrics and Gynecology

## 2017-07-22 ENCOUNTER — Encounter: Payer: Self-pay | Admitting: Obstetrics and Gynecology

## 2017-07-22 VITALS — BP 109/70 | HR 68 | Ht 67.0 in | Wt 198.4 lb

## 2017-07-22 DIAGNOSIS — Z01419 Encounter for gynecological examination (general) (routine) without abnormal findings: Secondary | ICD-10-CM | POA: Diagnosis not present

## 2017-07-22 NOTE — Patient Instructions (Signed)
Preventive Care 18-39 Years, Female Preventive care refers to lifestyle choices and visits with your health care provider that can promote health and wellness. What does preventive care include?  A yearly physical exam. This is also called an annual well check.  Dental exams once or twice a year.  Routine eye exams. Ask your health care provider how often you should have your eyes checked.  Personal lifestyle choices, including: ? Daily care of your teeth and gums. ? Regular physical activity. ? Eating a healthy diet. ? Avoiding tobacco and drug use. ? Limiting alcohol use. ? Practicing safe sex. ? Taking vitamin and mineral supplements as recommended by your health care provider. What happens during an annual well check? The services and screenings done by your health care provider during your annual well check will depend on your age, overall health, lifestyle risk factors, and family history of disease. Counseling Your health care provider may ask you questions about your:  Alcohol use.  Tobacco use.  Drug use.  Emotional well-being.  Home and relationship well-being.  Sexual activity.  Eating habits.  Work and work Statistician.  Method of birth control.  Menstrual cycle.  Pregnancy history.  Screening You may have the following tests or measurements:  Height, weight, and BMI.  Diabetes screening. This is done by checking your blood sugar (glucose) after you have not eaten for a while (fasting).  Blood pressure.  Lipid and cholesterol levels. These may be checked every 5 years starting at age 38.  Skin check.  Hepatitis C blood test.  Hepatitis B blood test.  Sexually transmitted disease (STD) testing.  BRCA-related cancer screening. This may be done if you have a family history of breast, ovarian, tubal, or peritoneal cancers.  Pelvic exam and Pap test. This may be done every 3 years starting at age 38. Starting at age 30, this may be done  every 5 years if you have a Pap test in combination with an HPV test.  Discuss your test results, treatment options, and if necessary, the need for more tests with your health care provider. Vaccines Your health care provider may recommend certain vaccines, such as:  Influenza vaccine. This is recommended every year.  Tetanus, diphtheria, and acellular pertussis (Tdap, Td) vaccine. You may need a Td booster every 10 years.  Varicella vaccine. You may need this if you have not been vaccinated.  HPV vaccine. If you are 39 or younger, you may need three doses over 6 months.  Measles, mumps, and rubella (MMR) vaccine. You may need at least one dose of MMR. You may also need a second dose.  Pneumococcal 13-valent conjugate (PCV13) vaccine. You may need this if you have certain conditions and were not previously vaccinated.  Pneumococcal polysaccharide (PPSV23) vaccine. You may need one or two doses if you smoke cigarettes or if you have certain conditions.  Meningococcal vaccine. One dose is recommended if you are age 68-21 years and a first-year college student living in a residence hall, or if you have one of several medical conditions. You may also need additional booster doses.  Hepatitis A vaccine. You may need this if you have certain conditions or if you travel or work in places where you may be exposed to hepatitis A.  Hepatitis B vaccine. You may need this if you have certain conditions or if you travel or work in places where you may be exposed to hepatitis B.  Haemophilus influenzae type b (Hib) vaccine. You may need this  if you have certain risk factors.  Talk to your health care provider about which screenings and vaccines you need and how often you need them. This information is not intended to replace advice given to you by your health care provider. Make sure you discuss any questions you have with your health care provider. Document Released: 11/23/2001 Document Revised:  06/16/2016 Document Reviewed: 07/29/2015 Elsevier Interactive Patient Education  2017 Elsevier Inc.  

## 2017-07-22 NOTE — Progress Notes (Signed)
Subjective:   Stephanie Edwards is a 46 y.o. G0P0000 Caucasian female here for a routine well-woman exam.  No LMP recorded. Patient is not currently having periods (Reason: IUD).    Current complaints: none, just started thyroid medicine 3 weeks ago. 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  Past Medical History Past Medical History:  Diagnosis Date  . High cholesterol   . Vaginal Pap smear, abnormal 2000  . Vitamin D deficiency     Past Surgical History History reviewed. No pertinent surgical history.  Gynecologic History G0P0000  No LMP recorded. Patient is not currently having periods (Reason: IUD). Contraception: IUD Last Pap: ?Marland Kitchen Results were: normal Last mammogram: 05/2017. Results were: normal   Obstetric History OB History  Gravida Para Term Preterm AB Living  0 0 0 0 0 0  SAB TAB Ectopic Multiple Live Births  0 0 0 0 0        Current Medications Current Outpatient Prescriptions on File Prior to Visit  Medication Sig Dispense Refill  . levocetirizine (XYZAL) 5 MG tablet TAKE 1 TABLET(5 MG) BY MOUTH DAILY 90 tablet 0  . Levonorgestrel (KYLEENA) 19.5 MG IUD by Intrauterine route.     No current facility-administered medications on file prior to visit.     Review of Systems Patient denies any headaches, blurred vision, shortness of breath, chest pain, abdominal pain, problems with bowel movements, urination, or intercourse.  Objective:  BP 109/70   Pulse 68   Ht  (1.702 m)   Wt 198 lb 6.4 oz (90 kg)   BMI 31.07 kg/m   Physical Exam  General:  Well developed, well nourished, no acute distress. She is alert and oriented x3. Skin:  Warm and dry Neck:  Midline trachea, no thyromegaly or nodules Cardiovascular: Regular rate and rhythm, no murmur heard Lungs:  Effort normal, all lung fields clear to auscultation bilaterally Breasts:  No dominant palpable mass, retraction, or nipple discharge Abdomen:  Soft, non tender, no hepatosplenomegaly or masses Pelvic:  External genitalia is normal in appearance.  The vagina is normal in appearance. The cervix is bulbous, no CMT.  Thin prep pap is done with HR HPV cotesting. Uterus is felt to be normal size, shape, and contour.  No adnexal masses or tenderness noted.IUD string noted Extremities:  No swelling or varicosities noted Psych:  She has a normal mood and affect  Assessment:   Healthy well-woman exam IUD  Plan:   F/U 1 year for AE, or sooner if needed ``       `````````````  Melody Suzan Nailer, CNM

## 2017-07-25 LAB — CYTOLOGY - PAP

## 2017-10-27 DIAGNOSIS — E039 Hypothyroidism, unspecified: Secondary | ICD-10-CM | POA: Diagnosis not present

## 2017-10-27 DIAGNOSIS — E782 Mixed hyperlipidemia: Secondary | ICD-10-CM | POA: Diagnosis not present

## 2017-11-03 DIAGNOSIS — E782 Mixed hyperlipidemia: Secondary | ICD-10-CM | POA: Diagnosis not present

## 2017-11-03 DIAGNOSIS — E039 Hypothyroidism, unspecified: Secondary | ICD-10-CM | POA: Diagnosis not present

## 2017-11-18 DIAGNOSIS — R079 Chest pain, unspecified: Secondary | ICD-10-CM | POA: Diagnosis not present

## 2017-11-18 DIAGNOSIS — R42 Dizziness and giddiness: Secondary | ICD-10-CM | POA: Diagnosis not present

## 2017-11-18 DIAGNOSIS — R0600 Dyspnea, unspecified: Secondary | ICD-10-CM | POA: Diagnosis not present

## 2017-11-18 DIAGNOSIS — Z566 Other physical and mental strain related to work: Secondary | ICD-10-CM | POA: Diagnosis not present

## 2018-01-09 ENCOUNTER — Ambulatory Visit: Payer: BLUE CROSS/BLUE SHIELD | Admitting: Internal Medicine

## 2018-01-09 ENCOUNTER — Encounter: Payer: Self-pay | Admitting: Internal Medicine

## 2018-01-09 VITALS — BP 96/66 | HR 79 | Temp 98.3°F | Ht 67.0 in | Wt 193.8 lb

## 2018-01-09 DIAGNOSIS — F419 Anxiety disorder, unspecified: Secondary | ICD-10-CM | POA: Diagnosis not present

## 2018-01-09 DIAGNOSIS — Z1389 Encounter for screening for other disorder: Secondary | ICD-10-CM | POA: Diagnosis not present

## 2018-01-09 DIAGNOSIS — E669 Obesity, unspecified: Secondary | ICD-10-CM

## 2018-01-09 DIAGNOSIS — E785 Hyperlipidemia, unspecified: Secondary | ICD-10-CM

## 2018-01-09 DIAGNOSIS — R7989 Other specified abnormal findings of blood chemistry: Secondary | ICD-10-CM | POA: Diagnosis not present

## 2018-01-09 DIAGNOSIS — Z1231 Encounter for screening mammogram for malignant neoplasm of breast: Secondary | ICD-10-CM

## 2018-01-09 DIAGNOSIS — E559 Vitamin D deficiency, unspecified: Secondary | ICD-10-CM

## 2018-01-09 DIAGNOSIS — J309 Allergic rhinitis, unspecified: Secondary | ICD-10-CM | POA: Diagnosis not present

## 2018-01-09 DIAGNOSIS — K219 Gastro-esophageal reflux disease without esophagitis: Secondary | ICD-10-CM

## 2018-01-09 NOTE — Patient Instructions (Addendum)
Please take D3 5000 IU daily  Please sch fasting cholesterol check by mid August 2019  Try Tums or Zantac or Prilosec or Nexium for reflux/heartburn over the counter  Dr. Lucy AntiguaArin Isenstein (dermatology) on [address] if interested  Please call and schedule mammogram 05/23/18 due  Meditation apps Insight Timer, Calm, Headspace  Try Nasal saline, Flonase with Xyzal for  Debrox ear drops for ear wax over the counter     Cholesterol Cholesterol is a white, waxy, fat-like substance that is needed by the human body in small amounts. The liver makes all the cholesterol we need. Cholesterol is carried from the liver by the blood through the blood vessels. Deposits of cholesterol (plaques) may build up on blood vessel (artery) walls. Plaques make the arteries narrower and stiffer. Cholesterol plaques increase the risk for heart attack and stroke. You cannot feel your cholesterol level even if it is very high. The only way to know that it is high is to have a blood test. Once you know your cholesterol levels, you should keep a record of the test results. Work with your health care provider to keep your levels in the desired range. What do the results mean?  Total cholesterol is a rough measure of all the cholesterol in your blood.  LDL (low-density lipoprotein) is the "bad" cholesterol. This is the type that causes plaque to build up on the artery walls. You want this level to be low.  HDL (high-density lipoprotein) is the "good" cholesterol because it cleans the arteries and carries the LDL away. You want this level to be high.  Triglycerides are fat that the body can either burn for energy or store. High levels are closely linked to heart disease. What are the desired levels of cholesterol?  Total cholesterol below 200.  LDL below 100 for people who are at risk, below 70 for people at very high risk.  HDL above 40 is good. A level of 60 or higher is considered to be protective against heart  disease.  Triglycerides below 150. How can I lower my cholesterol? Diet Follow your diet program as told by your health care provider.  Choose fish or white meat chicken and [place], roasted or baked. Limit fatty cuts of red meat, fried foods, and processed meats, such as sausage and lunch meats.  Eat lots of fresh fruits and vegetables.  Choose whole grains, beans, pasta, potatoes, and cereals.  Choose olive oil, corn oil, or canola oil, and use only small amounts.  Avoid butter, mayonnaise, shortening, or palm kernel oils.  Avoid foods with trans fats.  Drink skim or nonfat milk and eat low-fat or nonfat yogurt and cheeses. Avoid whole milk, cream, ice cream, egg yolks, and full-fat cheeses.  Healthier desserts include angel food cake, ginger snaps, animal crackers, hard candy, popsicles, and low-fat or nonfat frozen yogurt. Avoid pastries, cakes, pies, and cookies.  Exercise  Follow your exercise program as told by your health care provider. A regular program: ? Helps to decrease LDL and raise HDL. ? Helps with weight control.  Do things that increase your activity level, such as gardening, walking, and taking the stairs.  Ask your health care provider about ways that you can be more active in your daily life.  Medicine  Take over-the-counter and prescription medicines only as told by your health care provider. ? Medicine may be prescribed by your health care provider to help lower cholesterol and decrease the risk for heart disease. This is usually done  if diet and exercise have failed to bring down cholesterol levels. ? If you have several risk factors, you may need medicine even if your levels are normal.  This information is not intended to replace advice given to you by your health care provider. Make sure you discuss any questions you have with your health care provider. Document Released: 06/22/2001 Document Revised: 04/24/2016 Document Reviewed: 03/27/2016 Elsevier  Interactive Patient Education  2018 [employer].  Hepatitis B Vaccine, Recombinant injection What is this medicine? HEPATITIS B VACCINE (hep uh TAHY tis B VAK seen) is a vaccine. It is used to prevent an infection with the hepatitis B virus. This medicine may be used for other purposes; ask your health care provider or pharmacist if you have questions. COMMON BRAND NAME(S): Engerix-B, Recombivax HB What should I tell my health care provider before I take this medicine? They need to know if you have any of these conditions: -fever, infection -heart disease -hepatitis B infection -immune system problems -kidney disease -an unusual or allergic reaction to vaccines, yeast, other medicines, foods, dyes, or preservatives -pregnant or trying to get pregnant -breast-feeding How should I use this medicine? This vaccine is for injection into a muscle. It is given by a health care professional. A copy of Vaccine Information Statements will be given before each vaccination. Read this sheet carefully each time. The sheet may change frequently. Talk to your pediatrician regarding the use of this medicine in children. While this drug may be prescribed for children as young as newborn for selected conditions, precautions do apply. Overdosage: If you think you have taken too much of this medicine contact a poison control center or emergency room at once. NOTE: This medicine is only for you. Do not share this medicine with others. What if I miss a dose? It is important not to miss your dose. Call your doctor or health care professional if you are unable to keep an appointment. What may interact with this medicine? -medicines that suppress your immune function like adalimumab, anakinra, infliximab -medicines to treat cancer -steroid medicines like prednisone or cortisone This list may not describe all possible interactions. Give your health care provider a list of all the medicines, herbs,  non-prescription drugs, or dietary supplements you use. Also tell them if you smoke, drink alcohol, or use illegal drugs. Some items may interact with your medicine. What should I watch for while using this medicine? See your health care provider for all shots of this vaccine as directed. You must have 3 shots of this vaccine for protection from hepatitis B infection. Tell your doctor right away if you have any serious or unusual side effects after getting this vaccine. What side effects may I notice from receiving this medicine? Side effects that you should report to your doctor or health care professional as soon as possible: -allergic reactions like skin rash, itching or hives, swelling of the face, lips, or tongue -breathing problems -confused, irritated -fast, irregular heartbeat -flu-like syndrome -numb, tingling pain -seizures -unusually weak or tired Side effects that usually do not require medical attention (report to your doctor or health care professional if they continue or are bothersome): -diarrhea -fever -headache -loss of appetite -muscle pain -nausea -pain, redness, swelling, or irritation at site where injected -tiredness This list may not describe all possible side effects. Call your doctor for medical advice about side effects. You may report side effects to FDA at 1-800-FDA-1088. Where should I keep my medicine? This drug is given in a  hospital or clinic and will not be stored at home. NOTE: This sheet is a summary. It may not cover all possible information. If you have questions about this medicine, talk to your doctor, pharmacist, or health care provider.  2018 Elsevier/Gold Standard (2014-01-28 13:26:01)  Vitamin D Deficiency Vitamin D deficiency is when your body does not have enough vitamin D. Vitamin D is important to your body for many reasons:  It helps the body to absorb two important minerals, called calcium and phosphorus.  It plays a role in bone  health.  It may help to prevent some diseases, such as diabetes and multiple sclerosis.  It plays a role in muscle function, including heart function.  You can get vitamin D by:  Eating foods that naturally contain vitamin D.  Eating or drinking milk or other dairy products that have vitamin D added to them.  Taking a vitamin D supplement or a multivitamin supplement that contains vitamin D.  Being in the sun. Your body naturally makes vitamin D when your skin is exposed to sunlight. Your body changes the sunlight into a form of the vitamin that the body can use.  If vitamin D deficiency is severe, it can cause a condition in which your bones become soft. In adults, this condition is called osteomalacia. In children, this condition is called rickets. What are the causes? Vitamin D deficiency may be caused by:  Not eating enough foods that contain vitamin D.  Not getting enough sun exposure.  Having certain digestive system diseases that make it difficult for your body to absorb vitamin D. These diseases include Crohn disease, chronic pancreatitis, and cystic fibrosis.  Having a surgery in which a part of the stomach or a part of the small intestine is removed.  Being obese.  Having chronic kidney disease or liver disease.  What increases the risk? This condition is more likely to develop in:  Older people.  People who do not spend much time outdoors.  People who live in a long-term care facility.  People who have had broken bones.  People with weak or thin bones (osteoporosis).  People who have a disease or condition that changes how the body absorbs vitamin D.  People who have dark skin.  People who take certain medicines, such as steroid medicines or certain seizure medicines.  People who are overweight or obese.  What are the signs or symptoms? In mild cases of vitamin D deficiency, there may not be any symptoms. If the condition is severe, symptoms may  include:  Bone pain.  Muscle pain.  Falling often.  Broken bones caused by a minor injury.  How is this diagnosed? This condition is usually diagnosed with a blood test. How is this treated? Treatment for this condition may depend on what caused the condition. Treatment options include:  Taking vitamin D supplements.  Taking a calcium supplement. Your health care provider will suggest what dose is best for you.  Follow these instructions at home:  Take medicines and supplements only as told by your health care provider.  Eat foods that contain vitamin D. Choices include: ? Fortified dairy products, cereals, or juices. Fortified means that vitamin D has been added to the food. Check the label on the package to be sure. ? Fatty fish, such as salmon or trout. ? Eggs. ? Oysters.  Do not use a tanning bed.  Maintain a healthy weight. Lose weight, if needed.  Keep all follow-up visits as told by your health  care provider. This is important. Contact a health care provider if:  Your symptoms do not go away.  You feel like throwing up (nausea) or you throw up (vomit).  You have fewer bowel movements than usual or it is difficult for you to have a bowel movement (constipation). This information is not intended to replace advice given to you by your health care provider. Make sure you discuss any questions you have with your health care provider. Document Released: 12/20/2011 Document Revised: 03/10/2016 Document Reviewed: 02/12/2015 Elsevier Interactive Patient Education  2018 Elsevier Inc.  Gastroesophageal Reflux Disease, Adult Normally, food travels down the esophagus and stays in the stomach to be digested. However, when a person has gastroesophageal reflux disease (GERD), food and stomach acid move back up into the esophagus. When this happens, the esophagus becomes sore and inflamed. Over time, GERD can create small holes (ulcers) in the lining of the esophagus. What are  the causes? This condition is caused by a problem with the muscle between the esophagus and the stomach (lower esophageal sphincter, or LES). Normally, the LES muscle closes after food passes through the esophagus to the stomach. When the LES is weakened or abnormal, it does not close properly, and that allows food and stomach acid to go back up into the esophagus. The LES can be weakened by certain dietary substances, medicines, and medical conditions, including:  Tobacco use.  Pregnancy.  Having a hiatal hernia.  Heavy alcohol use.  Certain foods and beverages, such as coffee, chocolate, onions, and peppermint.  What increases the risk? This condition is more likely to develop in:  People who have an increased body weight.  People who have connective tissue disorders.  People who use NSAID medicines.  What are the signs or symptoms? Symptoms of this condition include:  Heartburn.  Difficult or painful swallowing.  The feeling of having a lump in the throat.  Abitter taste in the mouth.  Bad breath.  Having a large amount of saliva.  Having an upset or bloated stomach.  Belching.  Chest pain.  Shortness of breath or wheezing.  Ongoing (chronic) cough or a night-time cough.  Wearing away of tooth enamel.  Weight loss.  Different conditions can cause chest pain. Make sure to see your health care provider if you experience chest pain. How is this diagnosed? Your health care provider will take a medical history and perform a physical exam. To determine if you have mild or severe GERD, your health care provider may also monitor how you respond to treatment. You may also have other tests, including:  An endoscopy toexamine your stomach and esophagus with a small camera.  A test thatmeasures the acidity level in your esophagus.  A test thatmeasures how much pressure is on your esophagus.  A barium swallow or modified barium swallow to show the shape, size,  and functioning of your esophagus.  How is this treated? The goal of treatment is to help relieve your symptoms and to prevent complications. Treatment for this condition may vary depending on how severe your symptoms are. Your health care provider may recommend:  Changes to your diet.  Medicine.  Surgery.  Follow these instructions at home: Diet  Follow a diet as recommended by your health care provider. This may involve avoiding foods and drinks such as: ? Coffee and tea (with or without caffeine). ? Drinks that containalcohol. ? Energy drinks and sports drinks. ? Carbonated drinks or sodas. ? Chocolate and cocoa. ? Peppermint and mint  flavorings. ? Garlic and onions. ? Horseradish. ? Spicy and acidic foods, including peppers, chili powder, curry powder, vinegar, hot sauces, and barbecue sauce. ? Citrus fruit juices and citrus fruits, such as oranges, lemons, and limes. ? Tomato-based foods, such as red sauce, chili, salsa, and pizza with red sauce. ? Fried and fatty foods, such as donuts, french fries, potato chips, and high-fat dressings. ? High-fat meats, such as hot dogs and fatty cuts of red and white meats, such as rib eye steak, sausage, ham, and bacon. ? High-fat dairy items, such as whole milk, butter, and cream cheese.  Eat small, frequent meals instead of large meals.  Avoid drinking large amounts of liquid with your meals.  Avoid eating meals during the 2-3 hours before bedtime.  Avoid lying down right after you eat.  Do not exercise right after you eat. General instructions  Pay attention to any changes in your symptoms.  Take over-the-counter and prescription medicines only as told by your health care provider. Do not take aspirin, ibuprofen, or other NSAIDs unless your health care provider told you to do so.  Do not use any tobacco products, including cigarettes, chewing tobacco, and e-cigarettes. If you need help quitting, ask your health care  provider.  Wear loose-fitting clothing. Do not wear anything tight around your waist that causes pressure on your abdomen.  Raise (elevate) the head of your bed 6 inches (15cm).  Try to reduce your stress, such as with yoga or meditation. If you need help reducing stress, ask your health care provider.  If you are overweight, reduce your weight to an amount that is healthy for you. Ask your health care provider for guidance about a safe weight loss goal.  Keep all follow-up visits as told by your health care provider. This is important. Contact a health care provider if:  You have new symptoms.  You have unexplained weight loss.  You have difficulty swallowing, or it hurts to swallow.  You have wheezing or a persistent cough.  Your symptoms do not improve with treatment.  You have a hoarse voice. Get help right away if:  You have pain in your arms, neck, jaw, teeth, or back.  You feel sweaty, dizzy, or light-headed.  You have chest pain or shortness of breath.  You vomit and your vomit looks like blood or coffee grounds.  You faint.  Your stool is bloody or black.  You cannot swallow, drink, or eat. This information is not intended to replace advice given to you by your health care provider. Make sure you discuss any questions you have with your health care provider. Document Released: 07/07/2005 Document Revised: 02/25/2016 Document Reviewed: 01/22/2015 Elsevier Interactive Patient Education  2018 [employer].  Food Choices for Gastroesophageal Reflux Disease, Adult When you have gastroesophageal reflux disease (GERD), the foods you eat and your eating habits are very important. Choosing the right foods can help ease your discomfort. What guidelines do I need to follow?  Choose fruits, vegetables, whole grains, and low-fat dairy products.  Choose low-fat meat, fish, and poultry.  Limit fats such as oils, salad dressings, butter, nuts, and avocado.  Keep a food  diary. This helps you identify foods that cause symptoms.  Avoid foods that cause symptoms. These may be different for everyone.  Eat small meals often instead of 3 large meals a day.  Eat your meals slowly, in a place where you are relaxed.  Limit fried foods.  Cook foods using methods other than frying.  Avoid drinking alcohol.  Avoid drinking large amounts of liquids with your meals.  Avoid bending over or lying down until 2-3 hours after eating. What foods are not recommended? These are some foods and drinks that may make your symptoms worse: Vegetables Tomatoes. Tomato juice. Tomato and spaghetti sauce. Chili peppers. Onion and garlic. Horseradish. Fruits Oranges, grapefruit, and lemon (fruit and juice). Meats High-fat meats, fish, and poultry. This includes hot dogs, ribs, ham, sausage, salami, and bacon. Dairy Whole milk and chocolate milk. Sour cream. Cream. Butter. Ice cream. Cream cheese. Drinks Coffee and tea. Bubbly (carbonated) drinks or energy drinks. Condiments Hot sauce. Barbecue sauce. Sweets/Desserts Chocolate and cocoa. Donuts. Peppermint and spearmint. Fats and Oils High-fat foods. This includes [place] fries and potato chips. Other Vinegar. Strong spices. This includes black pepper, white pepper, red pepper, cayenne, curry powder, cloves, ginger, and chili powder. The items listed above may not be a complete list of foods and drinks to avoid. Contact your dietitian for more information. This information is not intended to replace advice given to you by your health care provider. Make sure you discuss any questions you have with your health care provider. Document Released: 03/28/2012 Document Revised: 03/04/2016 Document Reviewed: 08/01/2013 Elsevier Interactive Patient Education  2017 [employer].

## 2018-01-09 NOTE — Progress Notes (Signed)
Pre visit review using our clinic review tool, if applicable. No additional management support is needed unless otherwise documented below in the visit note. 

## 2018-01-10 LAB — URINALYSIS, ROUTINE W REFLEX MICROSCOPIC
Bilirubin, UA: NEGATIVE
GLUCOSE, UA: NEGATIVE
Ketones, UA: NEGATIVE
LEUKOCYTES UA: NEGATIVE
Nitrite, UA: NEGATIVE
PH UA: 7 (ref 5.0–7.5)
PROTEIN UA: NEGATIVE
RBC, UA: NEGATIVE
Specific Gravity, UA: 1.019 (ref 1.005–1.030)
UUROB: 0.2 mg/dL (ref 0.2–1.0)

## 2018-01-11 ENCOUNTER — Encounter: Payer: Self-pay | Admitting: Internal Medicine

## 2018-01-11 DIAGNOSIS — J309 Allergic rhinitis, unspecified: Secondary | ICD-10-CM | POA: Insufficient documentation

## 2018-01-11 DIAGNOSIS — E669 Obesity, unspecified: Secondary | ICD-10-CM | POA: Insufficient documentation

## 2018-01-11 NOTE — Progress Notes (Signed)
Chief Complaint  Patient presents with  . New Patient (Initial Visit)   New patient  1. C/o weight gain she is not exercising currently  2. Anxiety/panic intermittent due to work stress in therapy which has helped does not want to try medications at this time. Also stressors dad with dementia and lives in [place].  3. H/o abnormal thyroid function tests following with Dr. [facility] on Levothyroxine 75 mcg qd now was 50 TPO Abs 7 10/27/17 Endocrine appt f/u 02/2018  4. C/o increased GERD x 6 weeks tried Zantac OTC but not using x 2 weeks not but tried medications x 2-3 weeks. Reducing caffeine/carbonation helps as well and lifestyle changes have helped a little. Denies epigastric pain.  5. HLD TC 234 TG 48, HDL 50.2, LDL 174 10/27/17 disc lifestyle changes.  6. Vit D def 23.8 11/18/17 rec take D3 5000 IU qd  7. C/o nodule in back of scalp noticed since 07/2017 w/o sx's just noticed  8. C/o allergy nasal sx's  Review of Systems  Constitutional: Negative for weight loss.  HENT: Negative for hearing loss.   Eyes: Negative for blurred vision.  Respiratory: Negative for shortness of breath.   Cardiovascular: Negative for chest pain.  Gastrointestinal: Positive for heartburn. Negative for abdominal pain.  Genitourinary: Negative for dysuria.  Skin:       +scalp nodule   Neurological: Negative for headaches.  Psychiatric/Behavioral: The patient is nervous/anxious.    Past Medical History:  Diagnosis Date  . High cholesterol   . Vaginal Pap smear, abnormal 2000  . Vitamin D deficiency    History reviewed. No pertinent surgical history. History reviewed. No pertinent family history. Social History   Socioeconomic History  . Marital status: Married    Spouse name: Not on file  . Number of children: Not on file  . Years of education: Not on file  . Highest education level: Not on file  Occupational History  . Not on file  Social Needs  . Financial resource strain: Not on file    . Food insecurity:    Worry: Not on file    Inability: Not on file  . Transportation needs:    Medical: Not on file    Non-medical: Not on file  Tobacco Use  . Smoking status: Never Smoker  . Smokeless tobacco: Never Used  Substance and Sexual Activity  . Alcohol use: Yes    Comment: occas  . Drug use: No  . Sexual activity: Yes    Birth control/protection: IUD    Comment: kyleena  Lifestyle  . Physical activity:    Days per week: Not on file    Minutes per session: Not on file  . Stress: Not on file  Relationships  . Social connections:    Talks on phone: Not on file    Gets together: Not on file    Attends religious service: Not on file    Active member of club or organization: Not on file    Attends meetings of clubs or organizations: Not on file    Relationship status: Not on file  . Intimate partner violence:    Fear of current or ex partner: Not on file    Emotionally abused: Not on file    Physically abused: Not on file    Forced sexual activity: Not on file  Other Topics Concern  . Not on file  Social History Narrative  . Not on file   Current Meds  Medication Sig  .  levocetirizine (XYZAL) 5 MG tablet TAKE 1 TABLET(5 MG) BY MOUTH DAILY  . Levonorgestrel (KYLEENA) 19.5 MG IUD by Intrauterine route.  Marland Kitchen levothyroxine (SYNTHROID, LEVOTHROID) 75 MCG tablet Take by mouth.   No Known Allergies Recent Results (from the past 2160 hour(s))  Urinalysis, Routine w reflex microscopic     Status: None   Collection Time: 01/09/18  3:20 PM  Result Value Ref Range   Specific Gravity, UA 1.019 1.005 - 1.030   pH, UA 7.0 5.0 - 7.5   Color, UA Yellow Yellow   Appearance Ur Clear Clear   Leukocytes, UA Negative Negative   Protein, UA Negative Negative/Trace   Glucose, UA Negative Negative   Ketones, UA Negative Negative   RBC, UA Negative Negative   Bilirubin, UA Negative Negative   Urobilinogen, Ur 0.2 0.2 - 1.0 mg/dL   Nitrite, UA Negative Negative   Microscopic  Examination Comment     Comment: Microscopic not indicated and not performed.   Objective  Body mass index is 30.35 kg/m. Wt Readings from Last 3 Encounters:  01/09/18 193 lb 12.8 oz (87.9 kg)  07/22/17 198 lb 6.4 oz (90 kg)  09/23/16 194 lb 1.6 oz (88 kg)   Temp Readings from Last 3 Encounters:  01/09/18 98.3 F (36.8 C) (Oral)  04/28/17 98.6 F (37 C) (Tympanic)   BP Readings from Last 3 Encounters:  01/09/18 96/66  07/22/17 109/70  04/28/17 112/78   Pulse Readings from Last 3 Encounters:  01/09/18 79  07/22/17 68  04/28/17 76    Physical Exam  Constitutional: She is oriented to person, place, and time and well-developed, well-nourished, and in no distress. Vital signs are normal.    HENT:  Head: Normocephalic and atraumatic.  Mouth/Throat: Oropharynx is clear and moist and mucous membranes are normal.  Mild fluid in ears and wax   Eyes: Pupils are equal, round, and reactive to light. Conjunctivae are normal.  Cardiovascular: Normal rate, regular rhythm and normal heart sounds.  Pulmonary/Chest: Effort normal and breath sounds normal.  Abdominal: Soft. Bowel sounds are normal.  Neurological: She is alert and oriented to person, place, and time. Gait normal. Gait normal.  Skin: Skin is warm, dry and intact.  Psychiatric: Mood, memory, affect and judgment normal.  Nursing note and vitals reviewed.   Assessment   1. Anxiety/panic  2. Abnormal thyroid labs I.e elevated TSH nl anti TPO 3. GERD 4. HLD  5. Vit D def  6. Obesity BMI>30  7. Likely epidermal inclusion cysts occipital scalp  8. HM 9. Allergy rhinitis with mild fluid in ears and wax as well   Plan   1. Currently in therapy does not want meds disc mediatation apps for phone  2. F/u endocrine next app 02/2018  3. Disc Tums, rolaids, Zantac otc prilosec/nexium  Lifestyle changes  4. Disc healthy diet choices and exercise rec  Check lipid at f/u 5. rec 5000 IU qd D3  6. See #4  7. Disc Dr. Roseanne Kaufman  in future consider if interested to get removed and tbse 8.  Had flu shot 07/2017  Tdap 05/2017 per pt  Pap 07/21/17 neg pap neg HPV [facility]  mammo referred due 05/23/18 pt to call and schedule  IUD kyleen since 07/2016 exp 5 years  no cycles but does have light spotting   Had labs 11/18/17 CMET, CBC, vit D low, anemia panel, lipid elevated, 10/27/17 had TSH, Ft4 normal reviewed care everywhere.check UA today   Physical at f/u check lipid,  hep B status at f/u  9. rec NS, flonase OTC with xyzal or other otc ah, try debrox ear drops for wax   [facility]  Endocrine Dr. Gershon Crane Connell  Provider: Dr. French Anaracy [facility]-Scocuz[facility]-Internal Medicine

## 2018-02-23 DIAGNOSIS — E039 Hypothyroidism, unspecified: Secondary | ICD-10-CM | POA: Diagnosis not present

## 2018-03-02 DIAGNOSIS — E039 Hypothyroidism, unspecified: Secondary | ICD-10-CM | POA: Diagnosis not present

## 2018-04-10 ENCOUNTER — Ambulatory Visit: Payer: Self-pay | Admitting: Medical

## 2018-04-10 ENCOUNTER — Encounter: Payer: Self-pay | Admitting: Medical

## 2018-04-10 VITALS — BP 131/68 | HR 86 | Temp 99.4°F | Resp 16 | Wt 194.6 lb

## 2018-04-10 DIAGNOSIS — H6121 Impacted cerumen, right ear: Secondary | ICD-10-CM

## 2018-04-10 NOTE — Patient Instructions (Signed)
Earwax Buildup, Adult The ears produce a substance called earwax that helps keep bacteria out of the ear and protects the skin in the ear canal. Occasionally, earwax can build up in the ear and cause discomfort or hearing loss. What increases the risk? This condition is more likely to develop in people who:  Are female.  Are elderly.  Naturally produce more earwax.  Clean their ears often with cotton swabs.  Use earplugs often.  Use in-ear headphones often.  Wear hearing aids.  Have narrow ear canals.  Have earwax that is overly thick or sticky.  Have eczema.  Are dehydrated.  Have excess hair in the ear canal.  What are the signs or symptoms? Symptoms of this condition include:  Reduced or muffled hearing.  A feeling of fullness in the ear or feeling that the ear is plugged.  Fluid coming from the ear.  Ear pain.  Ear itch.  Ringing in the ear.  Coughing.  An obvious piece of earwax that can be seen inside the ear canal.  How is this diagnosed? This condition may be diagnosed based on:  Your symptoms.  Your medical history.  An ear exam. During the exam, your health care provider will look into your ear with an instrument called an otoscope.  You may have tests, including a hearing test. How is this treated? This condition may be treated by:  Using ear drops to soften the earwax.  Having the earwax removed by a health care provider. The health care provider may: ? Flush the ear with water. ? Use an instrument that has a loop on the end (curette). ? Use a suction device.  Surgery to remove the wax buildup. This may be done in severe cases.  Follow these instructions at home:  Take over-the-counter and prescription medicines only as told by your health care provider.  Do not put any objects, including cotton swabs, into your ear. You can clean the opening of your ear canal with a washcloth or facial tissue.  Follow instructions from your health  care provider about cleaning your ears. Do not over-clean your ears.  Drink enough fluid to keep your urine clear or pale yellow. This will help to thin the earwax.  Keep all follow-up visits as told by your health care provider. If earwax builds up in your ears often or if you use hearing aids, consider seeing your health care provider for routine, preventive ear cleanings. Ask your health care provider how often you should schedule your cleanings.  If you have hearing aids, clean them according to instructions from the manufacturer and your health care provider. Contact a health care provider if:  You have ear pain.  You develop a fever.  You have blood, pus, or other fluid coming from your ear.  You have hearing loss.  You have ringing in your ears that does not go away.  Your symptoms do not improve with treatment.  You feel like the room is spinning (vertigo). Summary  Earwax can build up in the ear and cause discomfort or hearing loss.  The most common symptoms of this condition include reduced or muffled hearing and a feeling of fullness in the ear or feeling that the ear is plugged.  This condition may be diagnosed based on your symptoms, your medical history, and an ear exam.  This condition may be treated by using ear drops to soften the earwax or by having the earwax removed by a health care provider.  Do   not put any objects, including cotton swabs, into your ear. You can clean the opening of your ear canal with a washcloth or facial tissue. This information is not intended to replace advice given to you by your health care provider. Make sure you discuss any questions you have with your health care provider. Document Released: 11/04/2004 Document Revised: 12/08/2016 Document Reviewed: 12/08/2016 Elsevier Interactive Patient Education  2018 Elsevier Inc.  

## 2018-04-10 NOTE — Progress Notes (Signed)
   Subjective:    Patient ID: Stephanie Edwards, female    DOB: 06/29/1971, 47 y.o.   MRN: [id number]  HPI 47 yo female in non acute distress. Would like her ears checked for wax, seen by her doctor last week and he told her to get some OTC medication for ear wax build up. She denies any problems hearing.Denies any pain. Blood pressure 131/68, pulse 86, temperature 99.4 F (37.4 C), temperature source Tympanic, resp. rate 16, weight 194 lb 9.6 oz (88.3 kg), SpO2 98 %.  Review of Systems  Constitutional: Negative for chills and fever.  HENT: Negative for congestion, ear discharge and ear pain.   No other complaints.     Objective:   Physical Exam  Constitutional: She appears well-developed and well-nourished.  HENT:  Head: Normocephalic and atraumatic.  Right Ear: Hearing, tympanic membrane and external ear normal.  Left Ear: Hearing, tympanic membrane, external ear and ear canal normal.  Eyes: Pupils are equal, round, and reactive to light. Conjunctivae and EOM are normal.  Nursing note and vitals reviewed.  Left ear clear of wax.  Right  Ear 1/2 of TM obscured by wax., wax appears soft.  S/p ear irrigation, all wax is now gone, TM wnl.     Assessment & Plan:  Cerumen in right ear Irrigated without incident. Return to the clinic as needed. Patient verbalizes understanding and has no questions at discharge.

## 2018-05-29 ENCOUNTER — Ambulatory Visit
Admission: RE | Admit: 2018-05-29 | Discharge: 2018-05-29 | Disposition: A | Discharge status: Home / Self Care | Payer: BLUE CROSS/BLUE SHIELD | Source: Ambulatory Visit | Attending: Internal Medicine | Admitting: Internal Medicine

## 2018-05-29 DIAGNOSIS — Z1231 Encounter for screening mammogram for malignant neoplasm of breast: Secondary | ICD-10-CM

## 2018-05-30 ENCOUNTER — Other Ambulatory Visit: Payer: Self-pay | Admitting: Internal Medicine

## 2018-05-30 DIAGNOSIS — R928 Other abnormal and inconclusive findings on diagnostic imaging of breast: Secondary | ICD-10-CM

## 2018-06-06 ENCOUNTER — Other Ambulatory Visit: Payer: Self-pay | Admitting: Internal Medicine

## 2018-06-06 DIAGNOSIS — R928 Other abnormal and inconclusive findings on diagnostic imaging of breast: Secondary | ICD-10-CM

## 2018-06-07 ENCOUNTER — Ambulatory Visit
Admission: RE | Admit: 2018-06-07 | Discharge: 2018-06-07 | Disposition: A | Discharge status: Home / Self Care | Payer: BLUE CROSS/BLUE SHIELD | Source: Ambulatory Visit | Attending: Internal Medicine | Admitting: Internal Medicine

## 2018-06-07 DIAGNOSIS — R928 Other abnormal and inconclusive findings on diagnostic imaging of breast: Secondary | ICD-10-CM

## 2018-07-11 ENCOUNTER — Encounter: Payer: Self-pay | Admitting: Internal Medicine

## 2018-07-11 ENCOUNTER — Ambulatory Visit (INDEPENDENT_AMBULATORY_CARE_PROVIDER_SITE_OTHER): Payer: BLUE CROSS/BLUE SHIELD | Admitting: Internal Medicine

## 2018-07-11 VITALS — BP 130/70 | HR 69 | Temp 98.6°F | Ht 67.0 in | Wt 193.4 lb

## 2018-07-11 DIAGNOSIS — Z1389 Encounter for screening for other disorder: Secondary | ICD-10-CM

## 2018-07-11 DIAGNOSIS — Z0184 Encounter for antibody response examination: Secondary | ICD-10-CM

## 2018-07-11 DIAGNOSIS — Z1159 Encounter for screening for other viral diseases: Secondary | ICD-10-CM

## 2018-07-11 DIAGNOSIS — E785 Hyperlipidemia, unspecified: Secondary | ICD-10-CM | POA: Diagnosis not present

## 2018-07-11 DIAGNOSIS — T7840XD Allergy, unspecified, subsequent encounter: Secondary | ICD-10-CM

## 2018-07-11 DIAGNOSIS — Z0000 Encounter for general adult medical examination without abnormal findings: Secondary | ICD-10-CM | POA: Diagnosis not present

## 2018-07-11 DIAGNOSIS — Z1231 Encounter for screening mammogram for malignant neoplasm of breast: Secondary | ICD-10-CM

## 2018-07-11 DIAGNOSIS — T7840XA Allergy, unspecified, initial encounter: Secondary | ICD-10-CM | POA: Insufficient documentation

## 2018-07-11 MED ORDER — LEVOCETIRIZINE DIHYDROCHLORIDE 5 MG PO TABS: ORAL_TABLET | ORAL | 3 refills | Status: AC

## 2018-07-11 NOTE — Progress Notes (Signed)
Pre visit review using our clinic review tool, if applicable. No additional management support is needed unless otherwise documented below in the visit note. 

## 2018-07-11 NOTE — Progress Notes (Signed)
Chief Complaint  Patient presents with  . Annual Exam   Annual doing well no issues  1. Hypothyroidism seeing [facility] dose levo 88 mcg now  2. Right occipital lesion on scalp increases and decreases in size and left neck skin tag declines to see dermatology for now  3. Will get flu shot at work [employer]  4. HLD tried changing diet and exercise   Review of Systems  Constitutional: Negative for weight loss.  HENT: Negative for hearing loss.   Eyes: Negative for blurred vision.  Respiratory: Negative for shortness of breath.   Cardiovascular: Negative for chest pain.  Gastrointestinal: Negative for abdominal pain.  Musculoskeletal: Negative for falls.  Skin: Negative for rash.  Neurological: Negative for headaches.  Psychiatric/Behavioral: Negative for depression.   Past Medical History:  Diagnosis Date  . Anxiety   . GERD (gastroesophageal reflux disease)   . High cholesterol   . Thyroid disease   . Vaginal Pap smear, abnormal 2000  . Vitamin D deficiency   . Vitamin D deficiency    No past surgical history on file. Family History  Problem Relation Age of Onset  . Hearing loss Father   . Heart disease Father   . Dementia Father   . Hyperlipidemia Brother   . Stroke Maternal Grandfather   . Breast cancer Neg Hx    Social History   Socioeconomic History  . Marital status: Married    Spouse name: Not on file  . Number of children: Not on file  . Years of education: Not on file  . Highest education level: Not on file  Occupational History  . Not on file  Social Needs  . Financial resource strain: Not on file  . Food insecurity:    Worry: Not on file    Inability: Not on file  . Transportation needs:    Medical: Not on file    Non-medical: Not on file  Tobacco Use  . Smoking status: Never Smoker  . Smokeless tobacco: Never Used  Substance and Sexual Activity  . Alcohol use: Yes    Comment: occas  . Drug use: No  . Sexual activity: Yes    Birth  control/protection: IUD    Comment: kyleena  Lifestyle  . Physical activity:    Days per week: Not on file    Minutes per session: Not on file  . Stress: Not on file  Relationships  . Social connections:    Talks on phone: Not on file    Gets together: Not on file    Attends religious service: Not on file    Active member of club or organization: Not on file    Attends meetings of clubs or organizations: Not on file    Relationship status: Not on file  . Intimate partner violence:    Fear of current or ex partner: Not on file    Emotionally abused: Not on file    Physically abused: Not on file    Forced sexual activity: Not on file  Other Topics Concern  . 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 levocetirizine (XYZAL) 5 MG tablet TAKE 1 TABLET(5 MG) BY MOUTH DAILY  . Levonorgestrel (KYLEENA) 19.5 MG IUD by Intrauterine route.  Marland Kitchen levothyroxine (SYNTHROID, LEVOTHROID) 75 MCG tablet Take by mouth.  No Known Allergies No results found for this or any previous visit (from the past 2160 hour(s)). Objective  Body mass index is 30.29 kg/m. Wt Readings from Last 3 Encounters:  07/11/18 193 lb 6.4 oz (87.7 kg)  04/10/18 194 lb 9.6 oz (88.3 kg)  01/09/18 193 lb 12.8 oz (87.9 kg)   Temp Readings from Last 3 Encounters:  07/11/18 98.6 F (37 C) (Oral)  04/10/18 99.4 F (37.4 C) (Tympanic)  01/09/18 98.3 F (36.8 C) (Oral)   BP Readings from Last 3 Encounters:  07/11/18 130/70  04/10/18 131/68  01/09/18 96/66   Pulse Readings from Last 3 Encounters:  07/11/18 69  04/10/18 86  01/09/18 79    Physical Exam  Constitutional: She is oriented to person, place, and time. Vital signs are normal. She appears well-developed and well-nourished. She is cooperative.  HENT:  Head: Normocephalic and atraumatic.  Mouth/Throat: Oropharynx is clear  and moist and mucous membranes are normal.  Eyes: Pupils are equal, round, and reactive to light. Conjunctivae are normal.  Cardiovascular: Normal rate, regular rhythm and normal heart sounds.  Pulmonary/Chest: Effort normal and breath sounds normal.  Neurological: She is alert and oriented to person, place, and time. Gait normal.  Skin: Skin is warm and dry.     Psychiatric: She has a normal mood and affect. Her speech is normal and behavior is normal. Judgment and thought content normal. Cognition and memory are normal.  Nursing note and vitals reviewed.   Assessment   1. Annual  2. Hypothyroidism  3. HLD  Plan   1.  Will get flu shot at work  Tdap 05/2017 per pt  Check MMR status  Declined hep B status and HIV check   Check fasting labs asap CMET, CBC, lipid, UA, TSH, T4, vitamin D, MMR   Pap 07/21/17 neg pap neg HPV [facility]  mammo 06/07/18 negative 2nd f/u referred today  IUD kyleen since 07/2016 exp 5 years  no cycles but does have light spotting  rec exercise and healthy diet choices  Let me know when ready dermatology referral  rec D3 2000-5000 Iu qd otc per pt not compliant with this   2. Cont levo 88 and f/u endocrine  3. Check lipid fasting  Provider: Dr. Olivia Mackie McLean-Scocuzza-Internal Medicine

## 2018-07-11 NOTE — Patient Instructions (Addendum)
D3 2000-5000 IU daily  Let me know about dermatology referral  Schedule mammogram    Cholesterol Cholesterol is a white, waxy, fat-like substance that is needed by the human body in small amounts. The liver makes all the cholesterol we need. Cholesterol is carried from the liver by the blood through the blood vessels. Deposits of cholesterol (plaques) may build up on blood vessel (artery) walls. Plaques make the arteries narrower and stiffer. Cholesterol plaques increase the risk for heart attack and stroke. You cannot feel your cholesterol level even if it is very high. The only way to know that it is high is to have a blood test. Once you know your cholesterol levels, you should keep a record of the test results. Work with your health care provider to keep your levels in the desired range. What do the results mean?  Total cholesterol is a rough measure of all the cholesterol in your blood.  LDL (low-density lipoprotein) is the "bad" cholesterol. This is the type that causes plaque to build up on the artery walls. You want this level to be low.  HDL (high-density lipoprotein) is the "good" cholesterol because it cleans the arteries and carries the LDL away. You want this level to be high.  Triglycerides are fat that the body can either burn for energy or store. High levels are closely linked to heart disease. What are the desired levels of cholesterol?  Total cholesterol below 200.  LDL below 100 for people who are at risk, below 70 for people at very high risk.  HDL above 40 is good. A level of 60 or higher is considered to be protective against heart disease.  Triglycerides below 150. How can I lower my cholesterol? Diet Follow your diet program as told by your health care provider.  Choose fish or white meat chicken and [place], roasted or baked. Limit fatty cuts of red meat, fried foods, and processed meats, such as sausage and lunch meats.  Eat lots of fresh fruits and  vegetables.  Choose whole grains, beans, pasta, potatoes, and cereals.  Choose olive oil, corn oil, or canola oil, and use only small amounts.  Avoid butter, mayonnaise, shortening, or palm kernel oils.  Avoid foods with trans fats.  Drink skim or nonfat milk and eat low-fat or nonfat yogurt and cheeses. Avoid whole milk, cream, ice cream, egg yolks, and full-fat cheeses.  Healthier desserts include angel food cake, ginger snaps, animal crackers, hard candy, popsicles, and low-fat or nonfat frozen yogurt. Avoid pastries, cakes, pies, and cookies.  Exercise  Follow your exercise program as told by your health care provider. A regular program: ? Helps to decrease LDL and raise HDL. ? Helps with weight control.  Do things that increase your activity level, such as gardening, walking, and taking the stairs.  Ask your health care provider about ways that you can be more active in your daily life.  Medicine  Take over-the-counter and prescription medicines only as told by your health care provider. ? Medicine may be prescribed by your health care provider to help lower cholesterol and decrease the risk for heart disease. This is usually done if diet and exercise have failed to bring down cholesterol levels. ? If you have several risk factors, you may need medicine even if your levels are normal.  This information is not intended to replace advice given to you by your health care provider. Make sure you discuss any questions you have with your health care provider. Document Released:  06/22/2001 Document Revised: 04/24/2016 Document Reviewed: 03/27/2016 Elsevier Interactive Patient Education  2018 Elsevier Inc.  Seborrheic Keratosis Seborrheic keratosis is a common, noncancerous (benign) skin growth. This condition causes waxy, rough, tan, brown, or black spots to appear on the skin. These skin growths can be flat or raised. What are the causes? The cause of this condition is not  known. What increases the risk? This condition is more likely to develop in:  People who have a family history of seborrheic keratosis.  People who are 32 or older.  People who are pregnant.  People who have had estrogen replacement therapy.  What are the signs or symptoms? This condition often occurs on the face, chest, shoulders, back, or other areas. These growths:  Are usually painless, but may become irritated and itchy.  Can be yellow, brown, black, or other colors.  Are slightly raised or have a flat surface.  Are sometimes rough or wart-like in texture.  Are often waxy on the surface.  Are round or oval-shaped.  Sometimes look like they are "stuck on."  Often occur in groups, but may occur as a single growth.  How is this diagnosed? This condition is diagnosed with a medical history and physical exam. A sample of the growth may be tested (skin biopsy). You may need to see a skin specialist (dermatologist). How is this treated? Treatment is not usually needed for this condition, unless the growths are irritated or are often bleeding. You may also choose to have the growths removed if you do not like their appearance. Most commonly, these growths are treated with a procedure in which liquid nitrogen is applied to "freeze" off the growth (cryosurgery). They may also be burned off with electricity or cut off. Follow these instructions at home:  Watch your growth for any changes.  Keep all follow-up visits as told by your health care provider. This is important.  Do not scratch or pick at the growth or growths. This can cause them to become irritated or infected. Contact a health care provider if:  You suddenly have many new growths.  Your growth bleeds, itches, or hurts.  Your growth suddenly becomes larger or changes color. This information is not intended to replace advice given to you by your health care provider. Make sure you discuss any questions you have  with your health care provider. Document Released: 10/30/2010 Document Revised: 03/04/2016 Document Reviewed: 02/12/2015 Elsevier Interactive Patient Education  2018 [employer].   Vitamin D Deficiency Vitamin D deficiency is when your body does not have enough vitamin D. Vitamin D is important to your body for many reasons:  It helps the body to absorb two important minerals, called calcium and phosphorus.  It plays a role in bone health.  It may help to prevent some diseases, such as diabetes and multiple sclerosis.  It plays a role in muscle function, including heart function.  You can get vitamin D by:  Eating foods that naturally contain vitamin D.  Eating or drinking milk or other dairy products that have vitamin D added to them.  Taking a vitamin D supplement or a multivitamin supplement that contains vitamin D.  Being in the sun. Your body naturally makes vitamin D when your skin is exposed to sunlight. Your body changes the sunlight into a form of the vitamin that the body can use.  If vitamin D deficiency is severe, it can cause a condition in which your bones become soft. In adults, this condition is called  osteomalacia. In children, this condition is called rickets. What are the causes? Vitamin D deficiency may be caused by:  Not eating enough foods that contain vitamin D.  Not getting enough sun exposure.  Having certain digestive system diseases that make it difficult for your body to absorb vitamin D. These diseases include Crohn disease, chronic pancreatitis, and cystic fibrosis.  Having a surgery in which a part of the stomach or a part of the small intestine is removed.  Being obese.  Having chronic kidney disease or liver disease.  What increases the risk? This condition is more likely to develop in:  Older people.  People who do not spend much time outdoors.  People who live in a long-term care facility.  People who have had broken  bones.  People with weak or thin bones (osteoporosis).  People who have a disease or condition that changes how the body absorbs vitamin D.  People who have dark skin.  People who take certain medicines, such as steroid medicines or certain seizure medicines.  People who are overweight or obese.  What are the signs or symptoms? In mild cases of vitamin D deficiency, there may not be any symptoms. If the condition is severe, symptoms may include:  Bone pain.  Muscle pain.  Falling often.  Broken bones caused by a minor injury.  How is this diagnosed? This condition is usually diagnosed with a blood test. How is this treated? Treatment for this condition may depend on what caused the condition. Treatment options include:  Taking vitamin D supplements.  Taking a calcium supplement. Your health care provider will suggest what dose is best for you.  Follow these instructions at home:  Take medicines and supplements only as told by your health care provider.  Eat foods that contain vitamin D. Choices include: ? Fortified dairy products, cereals, or juices. Fortified means that vitamin D has been added to the food. Check the label on the package to be sure. ? Fatty fish, such as salmon or trout. ? Eggs. ? Oysters.  Do not use a tanning bed.  Maintain a healthy weight. Lose weight, if needed.  Keep all follow-up visits as told by your health care provider. This is important. Contact a health care provider if:  Your symptoms do not go away.  You feel like throwing up (nausea) or you throw up (vomit).  You have fewer bowel movements than usual or it is difficult for you to have a bowel movement (constipation). This information is not intended to replace advice given to you by your health care provider. Make sure you discuss any questions you have with your health care provider. Document Released: 12/20/2011 Document Revised: 03/10/2016 Document Reviewed:  02/12/2015 Elsevier Interactive Patient Education  2018 [employer].

## 2018-07-20 ENCOUNTER — Other Ambulatory Visit: Payer: Self-pay | Admitting: Internal Medicine

## 2018-07-20 DIAGNOSIS — Z0184 Encounter for antibody response examination: Secondary | ICD-10-CM | POA: Diagnosis not present

## 2018-07-20 DIAGNOSIS — E559 Vitamin D deficiency, unspecified: Secondary | ICD-10-CM | POA: Diagnosis not present

## 2018-07-20 DIAGNOSIS — Z0000 Encounter for general adult medical examination without abnormal findings: Secondary | ICD-10-CM | POA: Diagnosis not present

## 2018-07-20 DIAGNOSIS — E785 Hyperlipidemia, unspecified: Secondary | ICD-10-CM | POA: Diagnosis not present

## 2018-07-20 DIAGNOSIS — Z1389 Encounter for screening for other disorder: Secondary | ICD-10-CM | POA: Diagnosis not present

## 2018-07-21 LAB — COMPREHENSIVE METABOLIC PANEL
A/G RATIO: 2.2 (ref 1.2–2.2)
ALBUMIN: 4.2 g/dL (ref 3.5–5.5)
ALT: 10 IU/L (ref 0–32)
AST: 14 IU/L (ref 0–40)
Alkaline Phosphatase: 71 IU/L (ref 39–117)
BUN / CREAT RATIO: 17 (ref 9–23)
BUN: 15 mg/dL (ref 6–24)
Bilirubin Total: 0.3 mg/dL (ref 0.0–1.2)
CALCIUM: 9.1 mg/dL (ref 8.7–10.2)
CO2: 23 mmol/L (ref 20–29)
Chloride: 102 mmol/L (ref 96–106)
Creatinine, Ser: 0.9 mg/dL (ref 0.57–1.00)
GFR, EST AFRICAN AMERICAN: 89 mL/min/{1.73_m2} (ref >59)
GFR, EST NON AFRICAN AMERICAN: 77 mL/min/{1.73_m2} (ref >59)
GLOBULIN, TOTAL: 1.9 g/dL (ref 1.5–4.5)
Glucose: 89 mg/dL (ref 65–99)
POTASSIUM: 4.4 mmol/L (ref 3.5–5.2)
SODIUM: 139 mmol/L (ref 134–144)
Total Protein: 6.1 g/dL (ref 6.0–8.5)

## 2018-07-21 LAB — TSH: TSH: 1.18 u[IU]/mL (ref 0.450–4.500)

## 2018-07-21 LAB — URINALYSIS, ROUTINE W REFLEX MICROSCOPIC
Bilirubin, UA: NEGATIVE
Glucose, UA: NEGATIVE
KETONES UA: NEGATIVE
LEUKOCYTES UA: NEGATIVE
Nitrite, UA: NEGATIVE
PH UA: 7 (ref 5.0–7.5)
PROTEIN UA: NEGATIVE
RBC UA: NEGATIVE
Specific Gravity, UA: 1.007 (ref 1.005–1.030)
Urobilinogen, Ur: 0.2 mg/dL (ref 0.2–1.0)

## 2018-07-21 LAB — CBC WITH DIFFERENTIAL/PLATELET
BASOS: 1 %
Basophils Absolute: 0 10*3/uL (ref 0.0–0.2)
EOS (ABSOLUTE): 0.2 10*3/uL (ref 0.0–0.4)
EOS: 4 %
HEMATOCRIT: 38.9 % (ref 34.0–46.6)
HEMOGLOBIN: 13.5 g/dL (ref 11.1–15.9)
IMMATURE GRANULOCYTES: 0 %
Immature Grans (Abs): 0 10*3/uL (ref 0.0–0.1)
Lymphocytes Absolute: 1.5 10*3/uL (ref 0.7–3.1)
Lymphs: 29 %
MCH: 31.5 pg (ref 26.6–33.0)
MCHC: 34.7 g/dL (ref 31.5–35.7)
MCV: 91 fL (ref 79–97)
MONOCYTES: 10 %
MONOS ABS: 0.5 10*3/uL (ref 0.1–0.9)
Neutrophils Absolute: 2.9 10*3/uL (ref 1.4–7.0)
Neutrophils: 56 %
Platelets: 214 10*3/uL (ref 150–450)
RBC: 4.28 x10E6/uL (ref 3.77–5.28)
RDW: 12.7 % (ref 12.3–15.4)
WBC: 5.1 10*3/uL (ref 3.4–10.8)

## 2018-07-21 LAB — MEASLES/MUMPS/RUBELLA IMMUNITY: Rubella Antibodies, IGG: 2.53 index (ref >0.99)

## 2018-07-21 LAB — LIPID PANEL W/O CHOL/HDL RATIO
Cholesterol, Total: 206 mg/dL — ABNORMAL HIGH (ref 100–199)
HDL: 46 mg/dL (ref >39)
LDL Calculated: 152 mg/dL — ABNORMAL HIGH (ref 0–99)
Triglycerides: 39 mg/dL (ref 0–149)
VLDL Cholesterol Cal: 8 mg/dL (ref 5–40)

## 2018-07-21 LAB — VITAMIN D 25 HYDROXY (VIT D DEFICIENCY, FRACTURES): VIT D 25 HYDROXY: 24.9 ng/mL — AB (ref 30.0–100.0)

## 2018-07-21 LAB — T4, FREE: FREE T4: 1.72 ng/dL (ref 0.82–1.77)

## 2018-07-28 ENCOUNTER — Encounter: Payer: BLUE CROSS/BLUE SHIELD | Admitting: Obstetrics and Gynecology

## 2018-09-27 DIAGNOSIS — E039 Hypothyroidism, unspecified: Secondary | ICD-10-CM | POA: Diagnosis not present

## 2019-02-02 IMAGING — MG MM DIGITAL SCREENING BILAT W/ TOMO W/ CAD
8 of 12 series · 8 of 28 positions shown · non-contrast
Comparison: Previous exam(s).

CLINICAL DATA: Screening.

EXAM:
2D DIGITAL SCREENING BILATERAL MAMMOGRAM WITH CAD AND ADJUNCT TOMO

[R MLO synth-2D]
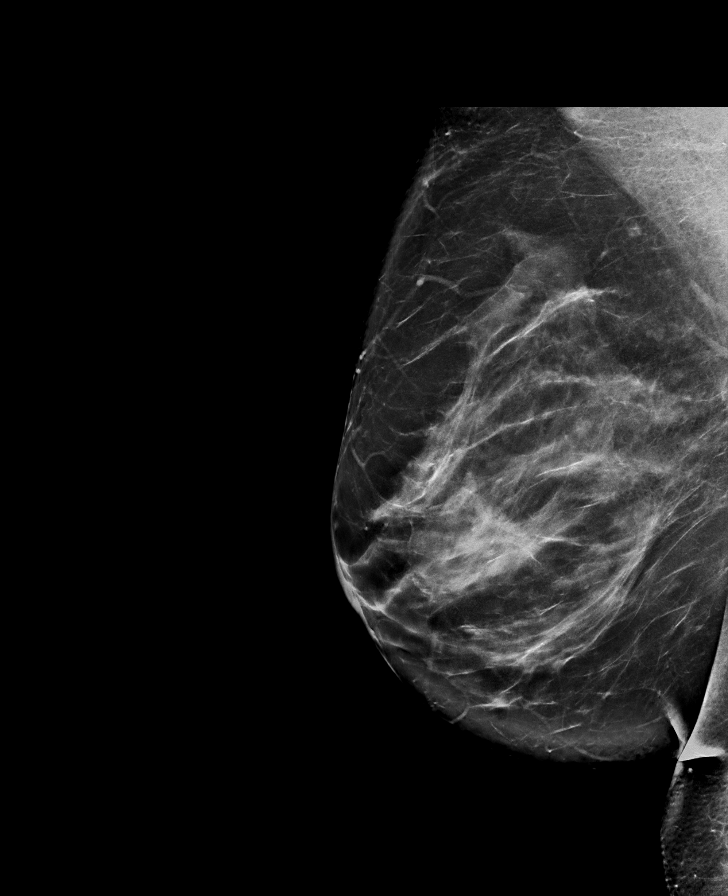

[L MLO synth-2D]
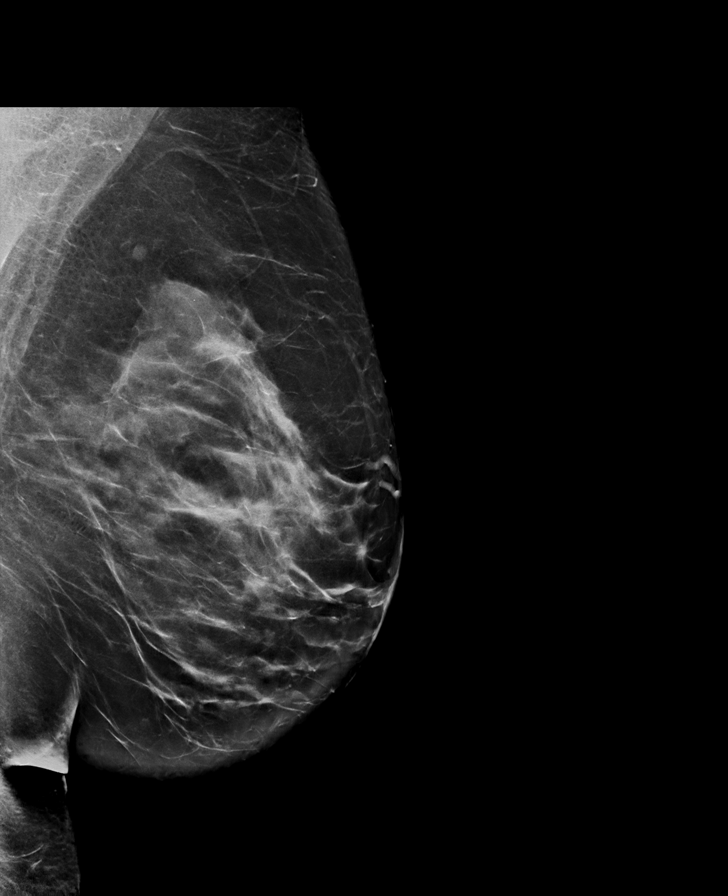

[R CC synth-2D]
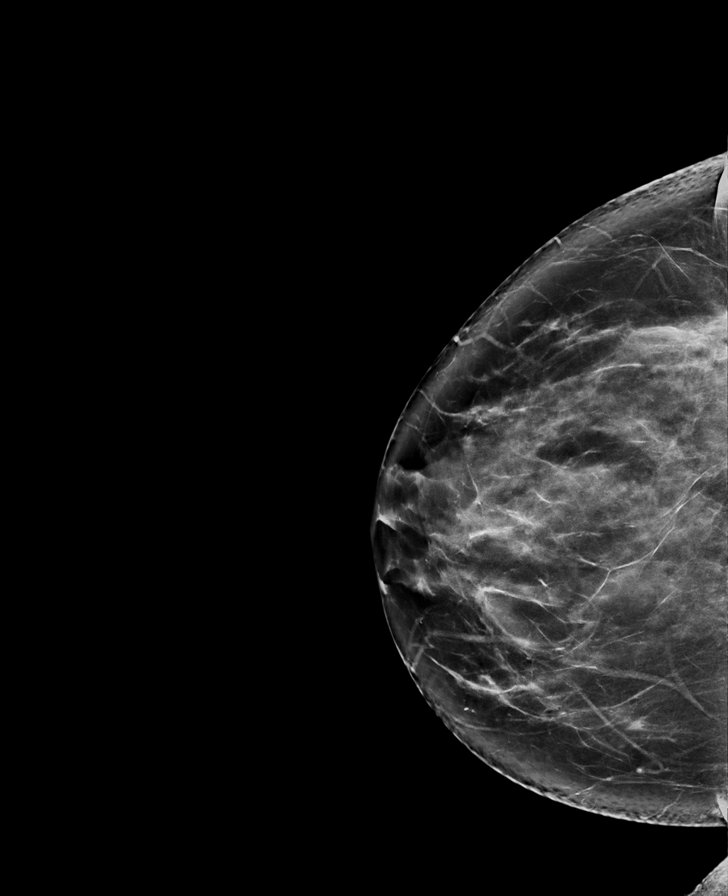

[L CC synth-2D]
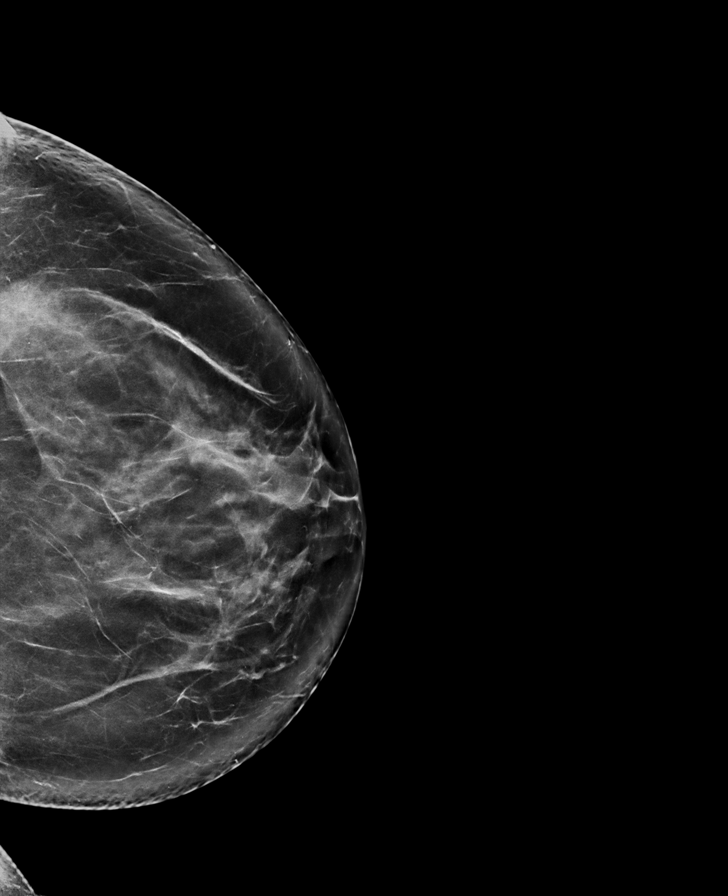

[L MLO]
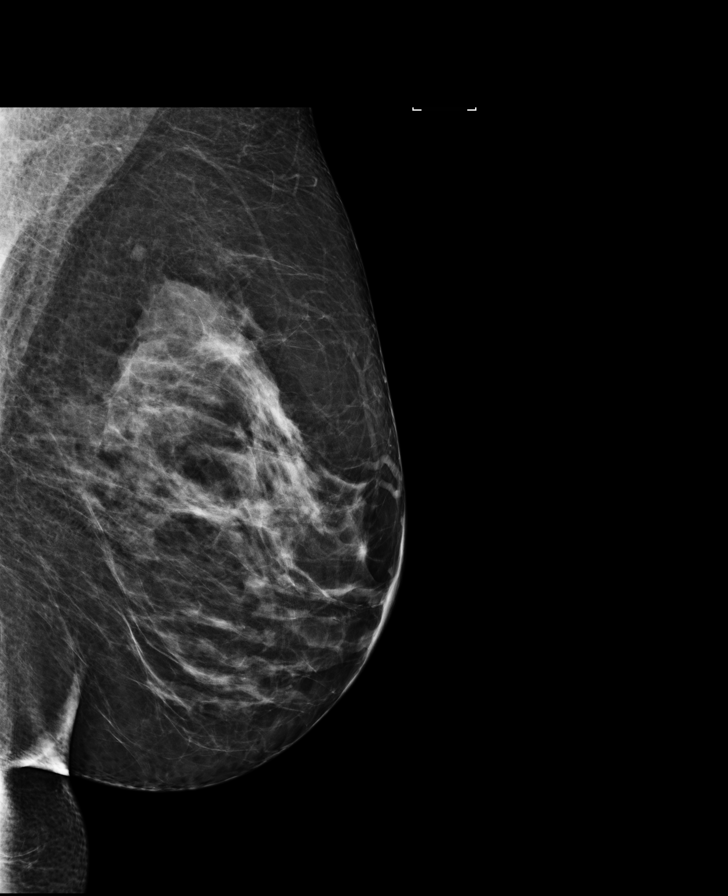

[R CC]
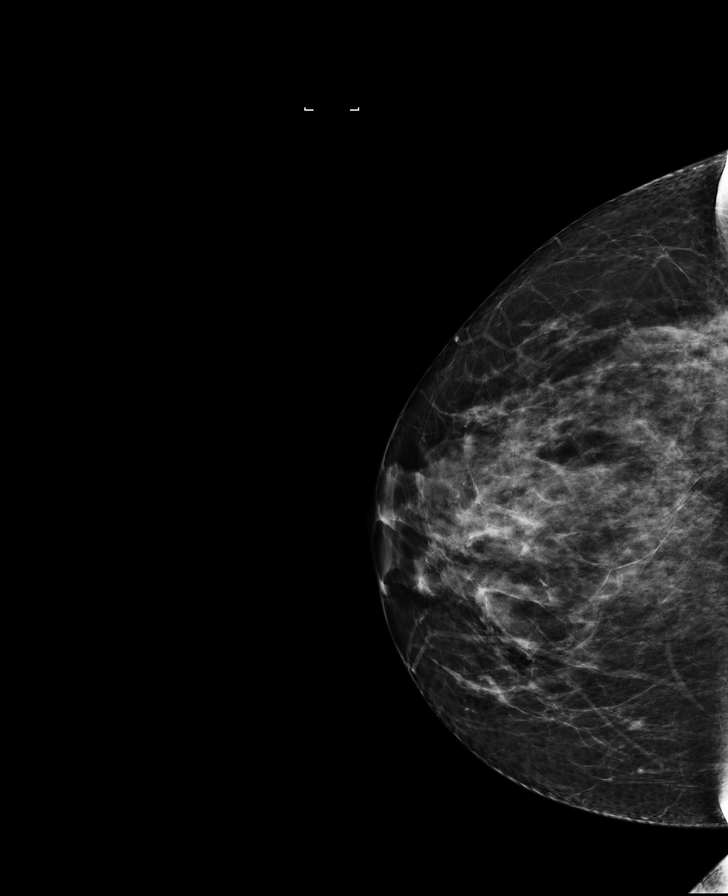

[R MLO]
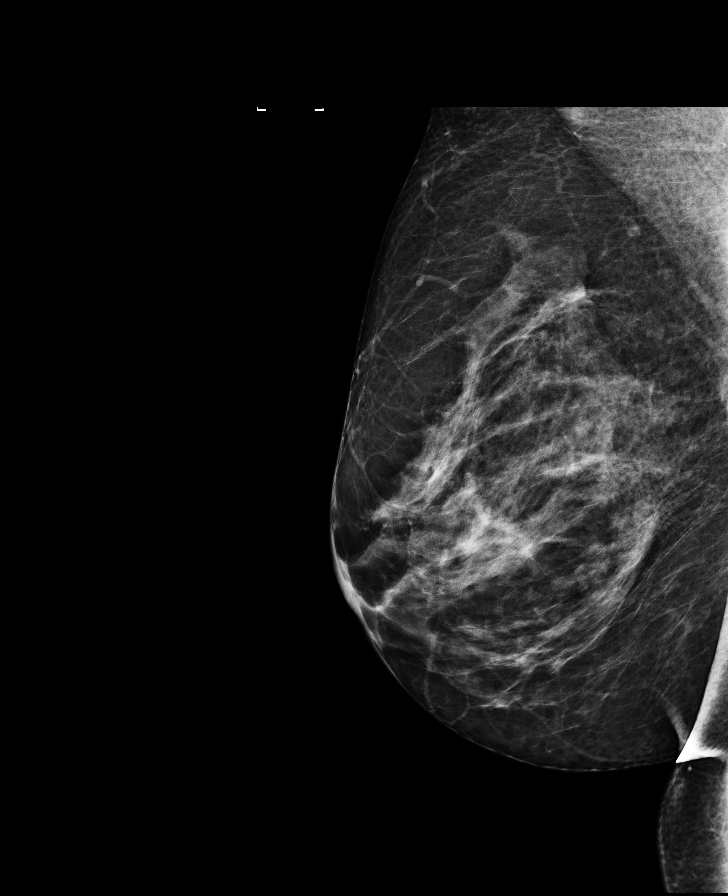

[L CC]
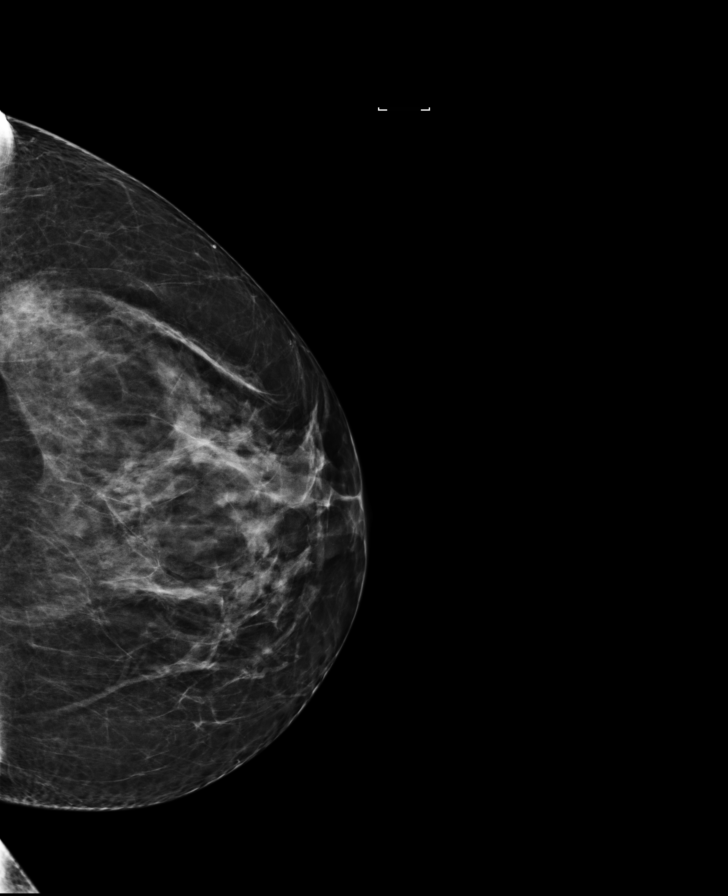

[8 of 28 positions shown; findings below may reference images not displayed]

ACR Breast Density Category c: The breast tissue is heterogeneously
dense, which may obscure small masses.
FINDINGS: There are no findings suspicious for malignancy. Images were
processed with CAD.
IMPRESSION: No mammographic evidence of malignancy. A result letter of this
screening mammogram will be mailed directly to the patient.

RECOMMENDATION:
Screening mammogram in one year. (Code:[id number])

BI-RADS CATEGORY  1: Negative.

## 2019-04-05 DIAGNOSIS — E039 Hypothyroidism, unspecified: Secondary | ICD-10-CM | POA: Diagnosis not present

## 2019-04-17 DIAGNOSIS — E039 Hypothyroidism, unspecified: Secondary | ICD-10-CM | POA: Diagnosis not present

## 2019-07-11 ENCOUNTER — Other Ambulatory Visit: Payer: Self-pay

## 2019-07-11 ENCOUNTER — Ambulatory Visit: Payer: Self-pay

## 2019-07-11 DIAGNOSIS — Z23 Encounter for immunization: Secondary | ICD-10-CM

## 2019-07-12 ENCOUNTER — Other Ambulatory Visit: Payer: Self-pay

## 2019-07-13 ENCOUNTER — Other Ambulatory Visit: Payer: Self-pay

## 2019-07-13 ENCOUNTER — Encounter: Payer: Self-pay | Admitting: Internal Medicine

## 2019-07-13 ENCOUNTER — Ambulatory Visit (INDEPENDENT_AMBULATORY_CARE_PROVIDER_SITE_OTHER): Payer: BC Managed Care – PPO | Admitting: Internal Medicine

## 2019-07-13 VITALS — BP 112/60 | HR 71 | Temp 97.4°F | Ht 67.0 in | Wt 203.0 lb

## 2019-07-13 DIAGNOSIS — Z0000 Encounter for general adult medical examination without abnormal findings: Secondary | ICD-10-CM

## 2019-07-13 DIAGNOSIS — E785 Hyperlipidemia, unspecified: Secondary | ICD-10-CM

## 2019-07-13 DIAGNOSIS — Z1329 Encounter for screening for other suspected endocrine disorder: Secondary | ICD-10-CM | POA: Diagnosis not present

## 2019-07-13 DIAGNOSIS — Z13220 Encounter for screening for lipoid disorders: Secondary | ICD-10-CM

## 2019-07-13 DIAGNOSIS — E039 Hypothyroidism, unspecified: Secondary | ICD-10-CM

## 2019-07-13 DIAGNOSIS — E559 Vitamin D deficiency, unspecified: Secondary | ICD-10-CM

## 2019-07-13 DIAGNOSIS — Z1389 Encounter for screening for other disorder: Secondary | ICD-10-CM

## 2019-07-13 NOTE — Patient Instructions (Signed)
LDL goal <130     High Cholesterol  High cholesterol is a condition in which the blood has high levels of a white, waxy, fat-like substance (cholesterol). The human body needs small amounts of cholesterol. The liver makes all the cholesterol that the body needs. Extra (excess) cholesterol comes from the food that we eat. Cholesterol is carried from the liver by the blood through the blood vessels. If you have high cholesterol, deposits (plaques) may build up on the walls of your blood vessels (arteries). Plaques make the arteries narrower and stiffer. Cholesterol plaques increase your risk for heart attack and stroke. Work with your health care provider to keep your cholesterol levels in a healthy range. What increases the risk? This condition is more likely to develop in people who:  Eat foods that are high in animal fat (saturated fat) or cholesterol.  Are overweight.  Are not getting enough exercise.  Have a family history of high cholesterol. What are the signs or symptoms? There are no symptoms of this condition. How is this diagnosed? This condition may be diagnosed from the results of a blood test.  If you are older than age 62, your health care provider may check your cholesterol every 4-6 years.  You may be checked more often if you already have high cholesterol or other risk factors for heart disease. The blood test for cholesterol measures:  "Bad" cholesterol (LDL cholesterol). This is the main type of cholesterol that causes heart disease. The desired level for LDL is less than 100.  "Good" cholesterol (HDL cholesterol). This type helps to protect against heart disease by cleaning the arteries and carrying the LDL away. The desired level for HDL is 60 or higher.  Triglycerides. These are fats that the body can store or burn for energy. The desired number for triglycerides is lower than 150.  Total cholesterol. This is a measure of the total amount of cholesterol in your  blood, including LDL cholesterol, HDL cholesterol, and triglycerides. A healthy number is less than 200. How is this treated? This condition is treated with diet changes, lifestyle changes, and medicines. Diet changes  This may include eating more whole grains, fruits, vegetables, nuts, and fish.  This may also include cutting back on red meat and foods that have a lot of added sugar. Lifestyle changes  Changes may include getting at least 40 minutes of aerobic exercise 3 times a week. Aerobic exercises include walking, biking, and swimming. Aerobic exercise along with a healthy diet can help you maintain a healthy weight.  Changes may also include quitting smoking. Medicines  Medicines are usually given if diet and lifestyle changes have failed to reduce your cholesterol to healthy levels.  Your health care provider may prescribe a statin medicine. Statin medicines have been shown to reduce cholesterol, which can reduce the risk of heart disease. Follow these instructions at home: Eating and drinking If told by your health care provider:  Eat chicken (without skin), fish, veal, shellfish, ground [place] breast, and round or loin cuts of red meat.  Do not eat fried foods or fatty meats, such as hot dogs and salami.  Eat plenty of fruits, such as apples.  Eat plenty of vegetables, such as broccoli, potatoes, and carrots.  Eat beans, peas, and lentils.  Eat grains such as barley, rice, couscous, and bulgur wheat.  Eat pasta without cream sauces.  Use skim or nonfat milk, and eat low-fat or nonfat yogurt and cheeses.  Do not eat or drink  whole milk, cream, ice cream, egg yolks, or hard cheeses.  Do not eat stick margarine or tub margarines that contain trans fats (also called partially hydrogenated oils).  Do not eat saturated tropical oils, such as coconut oil and palm oil.  Do not eat cakes, cookies, crackers, or other baked goods that contain trans fats.  General  instructions  Exercise as directed by your health care provider. Increase your activity level with activities such as gardening, walking, and taking the stairs.  Take over-the-counter and prescription medicines only as told by your health care provider.  Do not use any products that contain nicotine or tobacco, such as cigarettes and e-cigarettes. If you need help quitting, ask your health care provider.  Keep all follow-up visits as told by your health care provider. This is important. Contact a health care provider if:  You are struggling to maintain a healthy diet or weight.  You need help to start on an exercise program.  You need help to stop smoking. Get help right away if:  You have chest pain.  You have trouble breathing. This information is not intended to replace advice given to you by your health care provider. Make sure you discuss any questions you have with your health care provider. Document Released: 09/27/2005 Document Revised: 09/30/2017 Document Reviewed: 03/27/2016 Elsevier Patient Education  2020 [employer]  Cholesterol Content in Foods Cholesterol is a waxy, fat-like substance that helps to carry fat in the blood. The body needs cholesterol in small amounts, but too much cholesterol can cause damage to the arteries and heart. Most people should eat less than 200 milligrams (mg) of cholesterol a day. Foods with cholesterol  Cholesterol is found in animal-based foods, such as meat, seafood, and dairy. Generally, low-fat dairy and lean meats have less cholesterol than full-fat dairy and fatty meats. The milligrams of cholesterol per serving (mg per serving) of common cholesterol-containing foods are listed below. Meat and other proteins  Egg - one large whole egg has 186 mg.  Veal shank - 4 oz has 141 mg.  Lean ground [place] (93% lean) - 4 oz has 118 mg.  Fat-trimmed lamb loin - 4 oz has 106 mg.  Lean ground beef (90% lean) - 4 oz has 100 mg.  Lobster  - 3.5 oz has 90 mg.  Pork loin chops - 4 oz has 86 mg.  Canned salmon - 3.5 oz has 83 mg.  Fat-trimmed beef top loin - 4 oz has 78 mg.  Frankfurter - 1 frank (3.5 oz) has 77 mg.  Crab - 3.5 oz has 71 mg.  Roasted chicken without skin, white meat - 4 oz has 66 mg.  Light bologna - 2 oz has 45 mg.  Deli-cut [place] - 2 oz has 31 mg.  Canned tuna - 3.5 oz has 31 mg.  Bacon - 1 oz has 29 mg.  Oysters and mussels (raw) - 3.5 oz has 25 mg.  Mackerel - 1 oz has 22 mg.  Trout - 1 oz has 20 mg.  Pork sausage - 1 link (1 oz) has 17 mg.  Salmon - 1 oz has 16 mg.  Tilapia - 1 oz has 14 mg. Dairy  Soft-serve ice cream -  cup (4 oz) has 103 mg.  Whole-milk yogurt - 1 cup (8 oz) has 29 mg.  Cheddar cheese - 1 oz has 28 mg.  American cheese - 1 oz has 28 mg.  Whole milk - 1 cup (8 oz) has 23 mg.  2% milk - 1 cup (8 oz) has 18 mg.  Cream cheese - 1 tablespoon (Tbsp) has 15 mg.  Cottage cheese -  cup (4 oz) has 14 mg.  Low-fat (1%) milk - 1 cup (8 oz) has 10 mg.  Sour cream - 1 Tbsp has 8.5 mg.  Low-fat yogurt - 1 cup (8 oz) has 8 mg.  Nonfat Greek yogurt - 1 cup (8 oz) has 7 mg.  Half-and-half cream - 1 Tbsp has 5 mg. Fats and oils  Cod liver oil - 1 tablespoon (Tbsp) has 82 mg.  Butter - 1 Tbsp has 15 mg.  Lard - 1 Tbsp has 14 mg.  Bacon grease - 1 Tbsp has 14 mg.  Mayonnaise - 1 Tbsp has 5-10 mg.  Margarine - 1 Tbsp has 3-10 mg. Exact amounts of cholesterol in these foods may vary depending on specific ingredients and brands. Foods without cholesterol Most plant-based foods do not have cholesterol unless you combine them with a food that has cholesterol. Foods without cholesterol include:  Grains and cereals.  Vegetables.  Fruits.  Vegetable oils, such as olive, canola, and sunflower oil.  Legumes, such as peas, beans, and lentils.  Nuts and seeds.  Egg whites. Summary  The body needs cholesterol in small amounts, but too much cholesterol  can cause damage to the arteries and heart.  Most people should eat less than 200 milligrams (mg) of cholesterol a day. This information is not intended to replace advice given to you by your health care provider. Make sure you discuss any questions you have with your health care provider. Document Released: 05/24/2017 Document Revised: 09/09/2017 Document Reviewed: 05/24/2017 Elsevier Patient Education  2020 [employer].

## 2019-07-13 NOTE — Progress Notes (Signed)
Chief Complaint  Patient presents with  . Annual Exam   Annual doing well no complaints  1. HLD last year she is walking a ttimes 4x per week  2. Hypothyroidism f/u [facility] on levo 88 M-Sat and 1.5 Sundays   Review of Systems  Constitutional: Negative for weight loss.  HENT: Negative for hearing loss.   Eyes: Negative for blurred vision.  Respiratory: Negative for shortness of breath.   Cardiovascular: Negative for chest pain.  Gastrointestinal: Negative for abdominal pain.  Musculoskeletal: Negative for falls.  Skin: Negative for rash.  Neurological: Negative for headaches.  Psychiatric/Behavioral: Negative for depression.   Past Medical History:  Diagnosis Date  . Anxiety   . GERD (gastroesophageal reflux disease)   . High cholesterol   . Thyroid disease   . Vaginal Pap smear, abnormal 2000  . Vitamin D deficiency   . Vitamin D deficiency    No past surgical history on file. Family History  Problem Relation Age of Onset  . Hearing loss Father   . Heart disease Father   . Dementia Father   . Hyperlipidemia Brother   . Stroke Maternal Grandfather   . Breast cancer Neg Hx    Social History   Socioeconomic History  . Marital status: Married    Spouse name: Not on file  . Number of children: Not on file  . Years of education: Not on file  . Highest education level: Not on file  Occupational History  . Not on file  Social Needs  . Financial resource strain: Not on file  . Food insecurity    Worry: Not on file    Inability: Not on file  . Transportation needs    Medical: Not on file    Non-medical: Not on file  Tobacco Use  . Smoking status: Never Smoker  . Smokeless tobacco: Never Used  Substance and Sexual Activity  . Alcohol use: Yes    Comment: occas  . Drug use: No  . Sexual activity: Yes    Birth control/protection: I.U.D.    Comment: kyleena  Lifestyle  . Physical activity    Days per week: Not on file    Minutes per session: Not on file   . Stress: Not on file  Relationships  . Social Herbalist on phone: Not on file    Gets together: Not on file    Attends religious service: Not on file    Active member of club or organization: Not on file    Attends meetings of clubs or organizations: Not on file    Relationship status: Not on file  . Intimate partner violence    Fear of current or ex partner: Not on file    Emotionally abused: Not on file    Physically abused: Not on file    Forced sexual activity: Not on file  Other Topics Concern  . 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 levocetirizine (XYZAL) 5 MG tablet TAKE 1 TABLET(5 MG) BY MOUTH DAILY  . Levonorgestrel (KYLEENA) 19.5 MG IUD by Intrauterine route.  Marland Kitchen levothyroxine (SYNTHROID) 88 MCG tablet Take 88 mcg by mouth. 88 M-Sat and 1.5 tablets on Sunday Dr. Beckey Downing   No Known Allergies No results found for this or any previous visit (from  the past 2160 hour(s)). Objective  Body mass index is 31.79 kg/m. Wt Readings from Last 3 Encounters:  07/13/19 203 lb (92.1 kg)  07/11/18 193 lb 6.4 oz (87.7 kg)  04/10/18 194 lb 9.6 oz (88.3 kg)   Temp Readings from Last 3 Encounters:  07/13/19 (!) 97.4 F (36.3 C) (Temporal)  07/11/18 98.6 F (37 C) (Oral)  04/10/18 99.4 F (37.4 C) (Tympanic)   BP Readings from Last 3 Encounters:  07/13/19 112/60  07/11/18 130/70  04/10/18 131/68   Pulse Readings from Last 3 Encounters:  07/13/19 71  07/11/18 69  04/10/18 86    Physical Exam Vitals signs and nursing note reviewed.  Constitutional:      Appearance: Normal appearance. She is well-developed and well-groomed. She is obese.  HENT:     Head: Normocephalic and atraumatic.     Comments: +mask on   Eyes:     Conjunctiva/sclera: Conjunctivae normal.     Pupils: Pupils are equal, round, and reactive to light.   Cardiovascular:     Rate and Rhythm: Normal rate and regular rhythm.     Heart sounds: Normal heart sounds. No murmur.  Pulmonary:     Effort: Pulmonary effort is normal.     Breath sounds: Normal breath sounds.  Skin:    General: Skin is warm and dry.  Neurological:     General: No focal deficit present.     Mental Status: She is alert and oriented to person, place, and time. Mental status is at baseline.     Gait: Gait normal.  Psychiatric:        Attention and Perception: Attention and perception normal.        Mood and Affect: Mood and affect normal.        Speech: Speech normal.        Behavior: Behavior normal. Behavior is cooperative.        Thought Content: Thought content normal.        Cognition and Memory: Cognition and memory normal.        Judgment: Judgment normal.     Assessment  Plan  Annual physical exam - Plan:  -fasting labs [facility]  flu shot at work 07/12/19 Tdap 05/2017 per pt  rec MMR not immune   Declined hep B status and HIV check   Pap 07/21/17 neg pap neg HPV [facility]  mammo 06/07/18 negative  -ordered not scheduled  IUD kyleen since 07/2016 exp 5 years no cycles but does have light spotting  Consider colonscopy age 72 no GU blood now   rec exercise and healthy diet choices  Let me know when ready dermatology referral  rec D3 2000-5000 Iu qd otc per pt not compliant with this   Vitamin D deficiency - Plan: Vitamin D (25 hydroxy) -she is taking Vit D   Hyperlipidemia, unspecified hyperlipidemia type -given info  Hypothyroidism, unspecified type  f/u Dr. Hoy Finlay 01/23/20   Provider: Dr. Olivia Mackie McLean-Scocuz[facility]

## 2019-07-19 ENCOUNTER — Other Ambulatory Visit: Payer: Self-pay | Admitting: Internal Medicine

## 2019-07-19 ENCOUNTER — Encounter: Payer: Self-pay | Admitting: Internal Medicine

## 2019-07-19 NOTE — Addendum Note (Signed)
Addended by: Orland Mustard on: 07/19/2019 07:19 PM   Modules accepted: Orders

## 2019-07-24 ENCOUNTER — Other Ambulatory Visit: Payer: Self-pay | Admitting: Internal Medicine

## 2019-07-24 DIAGNOSIS — Z1231 Encounter for screening mammogram for malignant neoplasm of breast: Secondary | ICD-10-CM

## 2019-07-27 DIAGNOSIS — Z1329 Encounter for screening for other suspected endocrine disorder: Secondary | ICD-10-CM | POA: Diagnosis not present

## 2019-07-27 DIAGNOSIS — Z0000 Encounter for general adult medical examination without abnormal findings: Secondary | ICD-10-CM | POA: Diagnosis not present

## 2019-07-27 DIAGNOSIS — E559 Vitamin D deficiency, unspecified: Secondary | ICD-10-CM | POA: Diagnosis not present

## 2019-07-27 DIAGNOSIS — Z13220 Encounter for screening for lipoid disorders: Secondary | ICD-10-CM | POA: Diagnosis not present

## 2019-07-27 DIAGNOSIS — Z1389 Encounter for screening for other disorder: Secondary | ICD-10-CM | POA: Diagnosis not present

## 2019-07-28 LAB — URINALYSIS, ROUTINE W REFLEX MICROSCOPIC
Bilirubin, UA: NEGATIVE
Glucose, UA: NEGATIVE
Ketones, UA: NEGATIVE
Leukocytes,UA: NEGATIVE
Nitrite, UA: NEGATIVE
Protein,UA: NEGATIVE
RBC, UA: NEGATIVE
Specific Gravity, UA: 1.02 (ref 1.005–1.030)
Urobilinogen, Ur: 0.2 mg/dL (ref 0.2–1.0)
pH, UA: 7.5 (ref 5.0–7.5)

## 2019-07-28 LAB — COMPREHENSIVE METABOLIC PANEL
ALT: 8 IU/L (ref 0–32)
AST: 15 IU/L (ref 0–40)
Albumin/Globulin Ratio: 2 (ref 1.2–2.2)
Albumin: 4.5 g/dL (ref 3.8–4.8)
Alkaline Phosphatase: 77 IU/L (ref 39–117)
BUN/Creatinine Ratio: 14 (ref 9–23)
BUN: 13 mg/dL (ref 6–24)
Bilirubin Total: 0.5 mg/dL (ref 0.0–1.2)
CO2: 21 mmol/L (ref 20–29)
Calcium: 9.2 mg/dL (ref 8.7–10.2)
Chloride: 105 mmol/L (ref 96–106)
Creatinine, Ser: 0.96 mg/dL (ref 0.57–1.00)
GFR calc Af Amer: 81 mL/min/{1.73_m2} (ref >59)
GFR calc non Af Amer: 71 mL/min/{1.73_m2} (ref >59)
Globulin, Total: 2.2 g/dL (ref 1.5–4.5)
Glucose: 88 mg/dL (ref 65–99)
Potassium: 4.6 mmol/L (ref 3.5–5.2)
Sodium: 140 mmol/L (ref 134–144)
Total Protein: 6.7 g/dL (ref 6.0–8.5)

## 2019-07-28 LAB — TSH: TSH: 2.73 u[IU]/mL (ref 0.450–4.500)

## 2019-07-28 LAB — VITAMIN D 25 HYDROXY (VIT D DEFICIENCY, FRACTURES): Vit D, 25-Hydroxy: 40.4 ng/mL (ref 30.0–100.0)

## 2019-07-28 LAB — CBC WITH DIFFERENTIAL/PLATELET
Basophils Absolute: 0 10*3/uL (ref 0.0–0.2)
Basos: 1 %
EOS (ABSOLUTE): 0.2 10*3/uL (ref 0.0–0.4)
Eos: 4 %
Hematocrit: 39.1 % (ref 34.0–46.6)
Hemoglobin: 13.3 g/dL (ref 11.1–15.9)
Immature Grans (Abs): 0 10*3/uL (ref 0.0–0.1)
Immature Granulocytes: 0 %
Lymphocytes Absolute: 1.9 10*3/uL (ref 0.7–3.1)
Lymphs: 30 %
MCH: 30.8 pg (ref 26.6–33.0)
MCHC: 34 g/dL (ref 31.5–35.7)
MCV: 91 fL (ref 79–97)
Monocytes Absolute: 0.6 10*3/uL (ref 0.1–0.9)
Monocytes: 10 %
Neutrophils Absolute: 3.5 10*3/uL (ref 1.4–7.0)
Neutrophils: 55 %
Platelets: 223 10*3/uL (ref 150–450)
RBC: 4.32 x10E6/uL (ref 3.77–5.28)
RDW: 12.1 % (ref 11.7–15.4)
WBC: 6.2 10*3/uL (ref 3.4–10.8)

## 2019-07-28 LAB — LIPID PANEL
Chol/HDL Ratio: 5 ratio — ABNORMAL HIGH (ref 0.0–4.4)
Cholesterol, Total: 245 mg/dL — ABNORMAL HIGH (ref 100–199)
HDL: 49 mg/dL (ref >39)
LDL Chol Calc (NIH): 185 mg/dL — ABNORMAL HIGH (ref 0–99)
Triglycerides: 68 mg/dL (ref 0–149)
VLDL Cholesterol Cal: 11 mg/dL (ref 5–40)

## 2019-10-16 ENCOUNTER — Ambulatory Visit
Admission: RE | Admit: 2019-10-16 | Discharge: 2019-10-16 | Disposition: A | Discharge status: Home / Self Care | Payer: BC Managed Care – PPO | Source: Ambulatory Visit | Attending: Internal Medicine | Admitting: Internal Medicine

## 2019-10-16 DIAGNOSIS — Z1231 Encounter for screening mammogram for malignant neoplasm of breast: Secondary | ICD-10-CM | POA: Insufficient documentation

## 2020-01-24 DIAGNOSIS — E039 Hypothyroidism, unspecified: Secondary | ICD-10-CM | POA: Diagnosis not present

## 2020-02-04 DIAGNOSIS — E559 Vitamin D deficiency, unspecified: Secondary | ICD-10-CM | POA: Diagnosis not present

## 2020-02-04 DIAGNOSIS — E039 Hypothyroidism, unspecified: Secondary | ICD-10-CM | POA: Diagnosis not present

## 2020-05-12 ENCOUNTER — Encounter: Payer: Self-pay | Admitting: Medical

## 2020-05-12 ENCOUNTER — Other Ambulatory Visit: Payer: Self-pay

## 2020-05-12 ENCOUNTER — Ambulatory Visit: Payer: BC Managed Care – PPO | Admitting: Medical

## 2020-05-12 VITALS — BP 119/55 | HR 71 | Temp 97.2°F | Resp 16 | Wt 204.3 lb

## 2020-05-12 DIAGNOSIS — H6983 Other specified disorders of Eustachian tube, bilateral: Secondary | ICD-10-CM

## 2020-05-12 DIAGNOSIS — J309 Allergic rhinitis, unspecified: Secondary | ICD-10-CM

## 2020-05-12 MED ORDER — PREDNISONE 10 MG (21) PO TBPK: ORAL_TABLET | ORAL | 0 refills | Status: DC

## 2020-05-12 NOTE — Patient Instructions (Signed)
Zyrtec 10 mg daily continue Xyzal at night Flonase  2 squirts each nostril once a day  Prednisone as directed if needed.      Eustachian Tube Dysfunction  Eustachian tube dysfunction refers to a condition in which a blockage develops in the narrow passage that connects the middle ear to the back of the nose (eustachian tube). The eustachian tube regulates air pressure in the middle ear by letting air move between the ear and nose. It also helps to drain fluid from the middle ear space. Eustachian tube dysfunction can affect one or both ears. When the eustachian tube does not function properly, air pressure, fluid, or both can build up in the middle ear. What are the causes? This condition occurs when the eustachian tube becomes blocked or cannot open normally. Common causes of this condition include:  Ear infections.  Colds and other infections that affect the nose, mouth, and throat (upper respiratory tract).  Allergies.  Irritation from cigarette smoke.  Irritation from stomach acid coming up into the esophagus (gastroesophageal reflux). The esophagus is the tube that carries food from the mouth to the stomach.  Sudden changes in air pressure, such as from descending in an airplane or scuba diving.  Abnormal growths in the nose or throat, such as: ? Growths that line the nose (nasal polyps). ? Abnormal growth of cells (tumors). ? Enlarged tissue at the back of the throat (adenoids). What increases the risk? You are more likely to develop this condition if:  You smoke.  You are overweight.  You are a child who has: ? Certain birth defects of the mouth, such as cleft palate. ? Large tonsils or adenoids. What are the signs or symptoms? Common symptoms of this condition include:  A feeling of fullness in the ear.  Ear pain.  Clicking or popping noises in the ear.  Ringing in the ear.  Hearing loss.  Loss of balance.  Dizziness. Symptoms may get worse when the  air pressure around you changes, such as when you travel to an area of high elevation, fly on an airplane, or go scuba diving. How is this diagnosed? This condition may be diagnosed based on:  Your symptoms.  A physical exam of your ears, nose, and throat.  Tests, such as those that measure: ? The movement of your eardrum (tympanogram). ? Your hearing (audiometry). How is this treated? Treatment depends on the cause and severity of your condition.  In mild cases, you may relieve your symptoms by moving air into your ears. This is called "popping the ears."  In more severe cases, or if you have symptoms of fluid in your ears, treatment may include: ? Medicines to relieve congestion (decongestants). ? Medicines that treat allergies (antihistamines). ? Nasal sprays or ear drops that contain medicines that reduce swelling (steroids). ? A procedure to drain the fluid in your eardrum (myringotomy). In this procedure, a small tube is placed in the eardrum to:  Drain the fluid.  Restore the air in the middle ear space. ? A procedure to insert a balloon device through the nose to inflate the opening of the eustachian tube (balloon dilation). Follow these instructions at home: Lifestyle  Do not do any of the following until your health care provider approves: ? Travel to high altitudes. ? Fly in airplanes. ? Work in a Estate agent or room. ? Scuba dive.  Do not use any products that contain nicotine or tobacco, such as cigarettes and e-cigarettes. If you need help  quitting, ask your health care provider.  Keep your ears dry. Wear fitted earplugs during showering and bathing. Dry your ears completely after. General instructions  Take over-the-counter and prescription medicines only as told by your health care provider.  Use techniques to help pop your ears as recommended by your health care provider. These may include: ? Chewing gum. ? Yawning. ? Frequent, forceful  swallowing. ? Closing your mouth, holding your nose closed, and gently blowing as if you are trying to blow air out of your nose.  Keep all follow-up visits as told by your health care provider. This is important. Contact a health care provider if:  Your symptoms do not go away after treatment.  Your symptoms come back after treatment.  You are unable to pop your ears.  You have: ? A fever. ? Pain in your ear. ? Pain in your head or neck. ? Fluid draining from your ear.  Your hearing suddenly changes.  You become very dizzy.  You lose your balance. Summary  Eustachian tube dysfunction refers to a condition in which a blockage develops in the eustachian tube.  It can be caused by ear infections, allergies, inhaled irritants, or abnormal growths in the nose or throat.  Symptoms include ear pain, hearing loss, or ringing in the ears.  Mild cases are treated with maneuvers to unblock the ears, such as yawning or ear popping.  Severe cases are treated with medicines. Surgery may also be done (rare). This information is not intended to replace advice given to you by your health care provider. Make sure you discuss any questions you have with your health care provider. Document Revised: 01/17/2018 Document Reviewed: 01/17/2018 Elsevier Patient Education  2020 [employer] Prednisone tablets What is this medicine? PREDNISONE (PRED ni sone) is a corticosteroid. It is commonly used to treat inflammation of the skin, joints, lungs, and other organs. Common conditions treated include asthma, allergies, and arthritis. It is also used for other conditions, such as blood disorders and diseases of the adrenal glands. This medicine may be used for other purposes; ask your health care provider or pharmacist if you have questions. COMMON BRAND NAME(S): Deltasone, Predone, Sterapred, Sterapred DS What should I tell my health care provider before I take this medicine? They need to know if you  have any of these conditions:  Cushing's syndrome  diabetes  glaucoma  heart disease  high blood pressure  infection (especially a virus infection such as chickenpox, cold sores, or herpes)  kidney disease  liver disease  mental illness  myasthenia gravis  osteoporosis  seizures  stomach or intestine problems  thyroid disease  an unusual or allergic reaction to lactose, prednisone, other medicines, foods, dyes, or preservatives  pregnant or trying to get pregnant  breast-feeding How should I use this medicine? Take this medicine by mouth with a glass of water. Follow the directions on the prescription label. Take this medicine with food. If you are taking this medicine once a day, take it in the morning. Do not take more medicine than you are told to take. Do not suddenly stop taking your medicine because you may develop a severe reaction. Your doctor will tell you how much medicine to take. If your doctor wants you to stop the medicine, the dose may be slowly lowered over time to avoid any side effects. Talk to your pediatrician regarding the use of this medicine in children. Special care may be needed. Overdosage: If you think you have taken too much  of this medicine contact a poison control center or emergency room at once. NOTE: This medicine is only for you. Do not share this medicine with others. What if I miss a dose? If you miss a dose, take it as soon as you can. If it is almost time for your next dose, talk to your doctor or health care professional. You may need to miss a dose or take an extra dose. Do not take double or extra doses without advice. What may interact with this medicine? Do not take this medicine with any of the following medications:  metyrapone  mifepristone This medicine may also interact with the following medications:  aminoglutethimide  amphotericin B  aspirin and aspirin-like medicines  barbiturates  certain medicines for  diabetes, like glipizide or glyburide  cholestyramine  cholinesterase inhibitors  cyclosporine  digoxin  diuretics  ephedrine  female hormones, like estrogens and birth control pills  isoniazid  ketoconazole  NSAIDS, medicines for pain and inflammation, like ibuprofen or naproxen  phenytoin  rifampin  toxoids  vaccines  warfarin This list may not describe all possible interactions. Give your health care provider a list of all the medicines, herbs, non-prescription drugs, or dietary supplements you use. Also tell them if you smoke, drink alcohol, or use illegal drugs. Some items may interact with your medicine. What should I watch for while using this medicine? Visit your doctor or health care professional for regular checks on your progress. If you are taking this medicine over a prolonged period, carry an identification card with your name and address, the type and dose of your medicine, and your doctor's name and address. This medicine may increase your risk of getting an infection. Tell your doctor or health care professional if you are around anyone with measles or chickenpox, or if you develop sores or blisters that do not heal properly. If you are going to have surgery, tell your doctor or health care professional that you have taken this medicine within the last twelve months. Ask your doctor or health care professional about your diet. You may need to lower the amount of salt you eat. This medicine may increase blood sugar. Ask your healthcare provider if changes in diet or medicines are needed if you have diabetes. What side effects may I notice from receiving this medicine? Side effects that you should report to your doctor or health care professional as soon as possible:  allergic reactions like skin rash, itching or hives, swelling of the face, lips, or tongue  changes in emotions or moods  changes in vision  depressed mood  eye pain  fever or chills,  cough, sore throat, pain or difficulty passing urine  signs and symptoms of high blood sugar such as being more thirsty or hungry or having to urinate more than normal. You may also feel very tired or have blurry vision.  swelling of ankles, feet Side effects that usually do not require medical attention (report to your doctor or health care professional if they continue or are bothersome):  confusion, excitement, restlessness  headache  nausea, vomiting  skin problems, acne, thin and shiny skin  trouble sleeping  weight gain This list may not describe all possible side effects. Call your doctor for medical advice about side effects. You may report side effects to FDA at 1-800-FDA-1088. Where should I keep my medicine? Keep out of the reach of children. Store at room temperature between 15 and 30 degrees C (59 and 86 degrees F). Protect from  light. Keep container tightly closed. Throw away any unused medicine after the expiration date. NOTE: This sheet is a summary. It may not cover all possible information. If you have questions about this medicine, talk to your doctor, pharmacist, or health care provider.  2020 Elsevier/Gold Standard (2018-06-27 10:54:22) Fluticasone nasal spray What is this medicine? FLUTICASONE (floo TIK a sone) is a corticosteroid. This medicine is used to treat the symptoms of allergies like sneezing, itchy red eyes, and itchy, runny, or stuffy nose. This medicine is also used to treat nasal polyps. This medicine may be used for other purposes; ask your health care provider or pharmacist if you have questions. COMMON BRAND NAME(S): ClariSpray, Flonase, Flonase Allergy Relief, Flonase Sensimist, Veramyst, XHANCE What should I tell my health care provider before I take this medicine? They need to know if you have any of these conditions:  eye disease, vision problems  infection, like tuberculosis, herpes, or fungal infection  recent surgery on nose or  sinuses  taking a corticosteroid by mouth  an unusual or allergic reaction to fluticasone, steroids, other medicines, foods, dyes, or preservatives  pregnant or trying to get pregnant  breast-feeding How should I use this medicine? This medicine is for use in the nose. Follow the directions on your product or prescription label. This medicine works best if used at regular intervals. Do not use more often than directed. Make sure that you are using your nasal spray correctly. After 6 months of daily use for allergies, talk to your doctor or health care professional before using it for a longer time. Ask your doctor or health care professional if you have any questions. Talk to your pediatrician regarding the use of this medicine in children. Special care may be needed. Some products have been used for allergies in children as young as 2 years. After 2 months of daily use without a prescription in a child, talk to your pediatrician before using it for a longer time. Use of this medicine for nasal polyps is not approved in children. Overdosage: If you think you have taken too much of this medicine contact a poison control center or emergency room at once. NOTE: This medicine is only for you. Do not share this medicine with others. What if I miss a dose? If you miss a dose, use it as soon as you remember. If it is almost time for your next dose, use only that dose and continue with your regular schedule. Do not use double or extra doses. What may interact with this medicine?  certain antibiotics like clarithromycin and telithromycin  certain medicines for fungal infections like ketoconazole, itraconazole, and voriconazole  conivaptan  nefazodone  some medicines for HIV  vaccines This list may not describe all possible interactions. Give your health care provider a list of all the medicines, herbs, non-prescription drugs, or dietary supplements you use. Also tell them if you smoke, drink  alcohol, or use illegal drugs. Some items may interact with your medicine. What should I watch for while using this medicine? Visit your healthcare professional for regular checks on your progress. Tell your healthcare professional if your symptoms do not start to get better or if they get worse. This medicine may increase your risk of getting an infection. Tell your doctor or health care professional if you are around anyone with measles or chickenpox, or if you develop sores or blisters that do not heal properly. What side effects may I notice from receiving this medicine? Side effects that  you should report to your doctor or health care professional as soon as possible:  allergic reactions like skin rash, itching or hives, swelling of the face, lips, or tongue  changes in vision  crusting or sores in the nose  nosebleed  signs and symptoms of infection like fever or chills; cough; sore throat  white patches or sores in the mouth or nose Side effects that usually do not require medical attention (report to your doctor or health care professional if they continue or are bothersome):  burning or irritation inside the nose or throat  changes in taste or smell  cough  headache This list may not describe all possible side effects. Call your doctor for medical advice about side effects. You may report side effects to FDA at 1-800-FDA-1088. Where should I keep my medicine? Keep out of the reach of children. Store at room temperature between 15 and 30 degrees C (59 and 86 degrees F). Avoid exposure to extreme heat, cold, or light. Throw away any unused medicine after the expiration date. NOTE: This sheet is a summary. It may not cover all possible information. If you have questions about this medicine, talk to your doctor, pharmacist, or health care provider.  2020 Elsevier/Gold Standard (2017-10-20 14:10:08)

## 2020-05-12 NOTE — Progress Notes (Signed)
   Subjective:    Patient ID: Stephanie Edwards, female    DOB: [birth year], 49 y.o.   MRN: [id number]  HPI 49 yo female in non acute distress. Comes to clinic to day for decreased hearing , crackling and popplng in ears right worse then left.she is concerned that she has cerumen impaction, which she has had before.     Blood pressure (!) 119/55, pulse 71, temperature (!) 97.2 F (36.2 C), temperature source Temporal, resp. rate 16, weight (!) 204 lb 4.8 oz (92.7 kg), SpO2 98 %.  No Known Allergies  Review of Systems  HENT: Positive for hearing loss (decreased R.L). Negative for ear discharge and ear pain.   All other systems reviewed and are negative.      Objective:   Physical Exam Vitals and nursing note reviewed.  Constitutional:      Appearance: Normal appearance.  HENT:     Head: Normocephalic and atraumatic.     Right Ear: Ear canal and external ear normal. There is no impacted cerumen.     Left Ear: Ear canal and external ear normal. There is no impacted cerumen.  Eyes:     Extraocular Movements: Extraocular movements intact.     Conjunctiva/sclera: Conjunctivae normal.     Pupils: Pupils are equal, round, and reactive to light.  Pulmonary:     Effort: Pulmonary effort is normal.  Musculoskeletal:        General: Normal range of motion.     Cervical back: Normal range of motion.  Skin:    General: Skin is warm and dry.  Neurological:     General: No focal deficit present.     Mental Status: She is alert and oriented to person, place, and time.  Psychiatric:        Mood and Affect: Mood normal.        Behavior: Behavior normal.        Thought Content: Thought content normal.        Judgment: Judgment normal.           Assessment & Plan:  Eustachian tube dysfunction bilateral \\Allergic  Rhinitis Continue Xyzal at night, start Zyrtec during the day, Flonase as directed. Prednisone taper if needed.  Meds ordered this encounter  Medications  . predniSONE  (STERAPRED UNI-PAK 21 TAB) 10 MG (21) TBPK tablet    Sig: Take 6 tablets by mouth today , then  5 tablets tomorrow , then one less tablet every day thereafter. Take with food.    Dispense:  21 tablet    Refill:  0   Reviewed all directions with patient.  If worsening to return or call the clinic as discussed. Patient verbalizes understanding and has no questions at discharge.

## 2020-07-16 ENCOUNTER — Ambulatory Visit (INDEPENDENT_AMBULATORY_CARE_PROVIDER_SITE_OTHER): Payer: BC Managed Care – PPO | Admitting: Internal Medicine

## 2020-07-16 ENCOUNTER — Encounter: Payer: Self-pay | Admitting: Internal Medicine

## 2020-07-16 ENCOUNTER — Other Ambulatory Visit: Payer: Self-pay

## 2020-07-16 VITALS — BP 110/78 | HR 89 | Temp 98.6°F | Ht 67.01 in | Wt 203.4 lb

## 2020-07-16 DIAGNOSIS — Z1329 Encounter for screening for other suspected endocrine disorder: Secondary | ICD-10-CM | POA: Diagnosis not present

## 2020-07-16 DIAGNOSIS — Z1389 Encounter for screening for other disorder: Secondary | ICD-10-CM

## 2020-07-16 DIAGNOSIS — E669 Obesity, unspecified: Secondary | ICD-10-CM

## 2020-07-16 DIAGNOSIS — E785 Hyperlipidemia, unspecified: Secondary | ICD-10-CM

## 2020-07-16 DIAGNOSIS — Z13818 Encounter for screening for other digestive system disorders: Secondary | ICD-10-CM

## 2020-07-16 DIAGNOSIS — Z1231 Encounter for screening mammogram for malignant neoplasm of breast: Secondary | ICD-10-CM

## 2020-07-16 DIAGNOSIS — Z1211 Encounter for screening for malignant neoplasm of colon: Secondary | ICD-10-CM

## 2020-07-16 DIAGNOSIS — E039 Hypothyroidism, unspecified: Secondary | ICD-10-CM

## 2020-07-16 DIAGNOSIS — Z0000 Encounter for general adult medical examination without abnormal findings: Secondary | ICD-10-CM | POA: Diagnosis not present

## 2020-07-16 NOTE — Patient Instructions (Addendum)
Ob/gyn:  [facility] in [place] Or  [facility]  Pap due and Kyleena change     GI colonoscopy screening  -[facility] in [place] -[facility] [place] [facility] [place]   Debrox ear wax drops   Colonoscopy, Adult A colonoscopy is a procedure to look at the entire large intestine. This procedure is done using a long, thin, flexible tube that has a camera on the end. You may have a colonoscopy:  As a part of normal colorectal screening.  If you have certain symptoms, such as: ? A low number of red blood cells in your blood (anemia). ? Diarrhea that does not go away. ? Pain in your abdomen. ? Blood in your stool. A colonoscopy can help screen for and diagnose medical problems, including:  Tumors.  Extra tissue that grows where mucus forms (polyps).  Inflammation.  Areas of bleeding. Tell your health care provider about:  Any allergies you have.  All medicines you are taking, including vitamins, herbs, eye drops, creams, and over-the-counter medicines.  Any problems you or family members have had with anesthetic medicines.  Any blood disorders you have.  Any surgeries you have had.  Any medical conditions you have.  Any problems you have had with having bowel movements.  Whether you are pregnant or may be pregnant. What are the risks? Generally, this is a safe procedure. However, problems may occur, including:  Bleeding.  Damage to your intestine.  Allergic reactions to medicines given during the procedure.  Infection. This is rare. What happens before the procedure? Eating and drinking restrictions Follow instructions from your health care provider about eating or drinking restrictions, which may include:  A few days before the procedure: ? Follow a low-fiber diet. ? Avoid nuts, seeds, dried fruit, raw fruits, and vegetables.  1-3 days before the procedure: ? Eat only gelatin dessert or ice pops. ? Drink only clear liquids, such as  water, clear juice, clear broth or bouillon, black coffee or tea, or clear soft drinks or sports drinks. ? Avoid liquids that contain red or purple dye.  The day of the procedure: ? Do not eat solid foods. You may continue to drink clear liquids until up to 2 hours before the procedure. ? Do not eat or drink anything starting 2 hours before the procedure, or within the time period that your health care provider recommends. Bowel prep If you were prescribed a bowel prep to take by mouth (orally) to clean out your colon:  Take it as told by your health care provider. Starting the day before your procedure, you will need to drink a large amount of liquid medicine. The liquid will cause you to have many bowel movements of loose stool until your stool becomes almost clear or light green.  If your skin or the opening between the buttocks (anus) gets irritated from diarrhea, you may relieve the irritation using: ? Wipes with medicine in them, such as adult wet wipes with aloe and vitamin E. ? A product to soothe skin, such as petroleum jelly.  If you vomit while drinking the bowel prep: ? Take a break for up to 60 minutes. ? Begin the bowel prep again. ? Call your health care provider if you keep vomiting or you cannot take the bowel prep without vomiting.  To clean out your colon, you may also be given: ? Laxative medicines. These help you have a bowel movement. ? Instructions for enema use. An enema is liquid medicine  injected into your rectum. Medicines Ask your health care provider about:  Changing or stopping your regular medicines or supplements. This is especially important if you are taking iron supplements, diabetes medicines, or blood thinners.  Taking medicines such as aspirin and ibuprofen. These medicines can thin your blood. Do not take these medicines unless your health care provider tells you to take them.  Taking over-the-counter medicines, vitamins, herbs, and  supplements. General instructions  Ask your health care provider what steps will be taken to help prevent infection. These may include washing skin with a germ-killing soap.  Plan to have someone take you home from the hospital or clinic. What happens during the procedure?   An IV will be inserted into one of your veins.  You may be given one or more of the following: ? A medicine to help you relax (sedative). ? A medicine to numb the area (local anesthetic). ? A medicine to make you fall asleep (general anesthetic). This is rarely needed.  You will lie on your side with your knees bent.  The tube will: ? Have oil or gel put on it (be lubricated). ? Be inserted into your anus. ? Be gently eased through all parts of your large intestine.  Air will be sent into your colon to keep it open. This may cause some pressure or cramping.  Images will be taken with the camera and will appear on a screen.  A small tissue sample may be removed to be looked at under a microscope (biopsy). The tissue may be sent to a lab for testing if any signs of problems are found.  If small polyps are found, they may be removed and checked for cancer cells.  When the procedure is finished, the tube will be removed. The procedure may vary among health care providers and hospitals. What happens after the procedure?  Your blood pressure, heart rate, breathing rate, and blood oxygen level will be monitored until you leave the hospital or clinic.  You may have a small amount of blood in your stool.  You may pass gas and have mild cramping or bloating in your abdomen. This is caused by the air that was used to open your colon during the exam.  Do not drive for 24 hours after the procedure.  It is up to you to get the results of your procedure. Ask your health care provider, or the department that is doing the procedure, when your results will be ready. Summary  A colonoscopy is a procedure to look at the  entire large intestine.  Follow instructions from your health care provider about eating and drinking before the procedure.  If you were prescribed an oral bowel prep to clean out your colon, take it as told by your health care provider.  During the colonoscopy, a flexible tube with a camera on its end is inserted into the anus and then passed into the other parts of the large intestine. This information is not intended to replace advice given to you by your health care provider. Make sure you discuss any questions you have with your health care provider. Document Revised: 04/20/2019 Document Reviewed: 04/20/2019 Elsevier Patient Education  2020 [employer].    Cholesterol Content in Foods Cholesterol is a waxy, fat-like substance that helps to carry fat in the blood. The body needs cholesterol in small amounts, but too much cholesterol can cause damage to the arteries and heart. Most people should eat less than 200 milligrams (mg) of  cholesterol a day. Foods with cholesterol  Cholesterol is found in animal-based foods, such as meat, seafood, and dairy. Generally, low-fat dairy and lean meats have less cholesterol than full-fat dairy and fatty meats. The milligrams of cholesterol per serving (mg per serving) of common cholesterol-containing foods are listed below. Meat and other proteins  Egg -- one large whole egg has 186 mg.  Veal shank -- 4 oz has 141 mg.  Lean ground [place] (93% lean) -- 4 oz has 118 mg.  Fat-trimmed lamb loin -- 4 oz has 106 mg.  Lean ground beef (90% lean) -- 4 oz has 100 mg.  Lobster -- 3.5 oz has 90 mg.  Pork loin chops -- 4 oz has 86 mg.  Canned salmon -- 3.5 oz has 83 mg.  Fat-trimmed beef top loin -- 4 oz has 78 mg.  Frankfurter -- 1 frank (3.5 oz) has 77 mg.  Crab -- 3.5 oz has 71 mg.  Roasted chicken without skin, white meat -- 4 oz has 66 mg.  Light bologna -- 2 oz has 45 mg.  Deli-cut [place] -- 2 oz has 31 mg.  Canned tuna -- 3.5 oz  has 31 mg.  Tomasa Blase -- 1 oz has 29 mg.  Oysters and mussels (raw) -- 3.5 oz has 25 mg.  Mackerel -- 1 oz has 22 mg.  Trout -- 1 oz has 20 mg.  Pork sausage -- 1 link (1 oz) has 17 mg.  Salmon -- 1 oz has 16 mg.  Tilapia -- 1 oz has 14 mg. Dairy  Soft-serve ice cream --  cup (4 oz) has 103 mg.  Whole-milk yogurt -- 1 cup (8 oz) has 29 mg.  Cheddar cheese -- 1 oz has 28 mg.  American cheese -- 1 oz has 28 mg.  Whole milk -- 1 cup (8 oz) has 23 mg.  2% milk -- 1 cup (8 oz) has 18 mg.  Cream cheese -- 1 tablespoon (Tbsp) has 15 mg.  Cottage cheese --  cup (4 oz) has 14 mg.  Low-fat (1%) milk -- 1 cup (8 oz) has 10 mg.  Sour cream -- 1 Tbsp has 8.5 mg.  Low-fat yogurt -- 1 cup (8 oz) has 8 mg.  Nonfat Greek yogurt -- 1 cup (8 oz) has 7 mg.  Half-and-half cream -- 1 Tbsp has 5 mg. Fats and oils  Cod liver oil -- 1 tablespoon (Tbsp) has 82 mg.  Butter -- 1 Tbsp has 15 mg.  Lard -- 1 Tbsp has 14 mg.  Bacon grease -- 1 Tbsp has 14 mg.  Mayonnaise -- 1 Tbsp has 5-10 mg.  Margarine -- 1 Tbsp has 3-10 mg. Exact amounts of cholesterol in these foods may vary depending on specific ingredients and brands. Foods without cholesterol Most plant-based foods do not have cholesterol unless you combine them with a food that has cholesterol. Foods without cholesterol include:  Grains and cereals.  Vegetables.  Fruits.  Vegetable oils, such as olive, canola, and sunflower oil.  Legumes, such as peas, beans, and lentils.  Nuts and seeds.  Egg whites. Summary  The body needs cholesterol in small amounts, but too much cholesterol can cause damage to the arteries and heart.  Most people should eat less than 200 milligrams (mg) of cholesterol a day. This information is not intended to replace advice given to you by your health care provider. Make sure you discuss any questions you have with your health care provider. Document Revised: 09/09/2017 Document Reviewed:  05/24/2017 Elsevier Patient Education  [place].

## 2020-07-16 NOTE — Progress Notes (Signed)
Chief Complaint  Patient presents with  . Annual Exam    Pt declined Pap but would like breast exam   Annual  1. Hypothyroidism synthyroid 100 mcg qd f/u [facility]  2. HLD encouraged healthy diet and exercise    Review of Systems  Constitutional: Negative for weight loss.  HENT: Negative for hearing loss.   Eyes: Negative for blurred vision.  Respiratory: Negative for shortness of breath.   Cardiovascular: Negative for chest pain.  Gastrointestinal: Negative for abdominal pain.  Musculoskeletal: Negative for falls.  Skin: Negative for rash.  Neurological: Negative for headaches.  Psychiatric/Behavioral: Negative for depression.   Past Medical History:  Diagnosis Date  . Anxiety   . GERD (gastroesophageal reflux disease)   . High cholesterol   . Thyroid disease   . Vaginal Pap smear, abnormal 2000  . Vitamin D deficiency   . Vitamin D deficiency    No past surgical history on file. Family History  Problem Relation Age of Onset  . Hearing loss Father   . Heart disease Father   . Dementia Father   . Hyperlipidemia Brother   . Stroke Maternal Grandfather   . Breast cancer Neg Hx    Social History   Socioeconomic History  . Marital status: Married    Spouse name: Not on file  . Number of children: Not on file  . Years of education: Not on file  . Highest education level: Not on file  Occupational History  . Not on file  Tobacco Use  . Smoking status: Never Smoker  . Smokeless tobacco: Never Used  Vaping Use  . Vaping Use: Never used  Substance and Sexual Activity  . Alcohol use: Yes    Comment: occas  . Drug use: No  . Sexual activity: Yes    Birth control/protection: I.U.D.    Comment: kyleena  Other Topics Concern  . 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 Difficulty of Paying  Living Expenses: Not on file  Food Insecurity:   . Worried About Charity fundraiser in the Last Year: Not on file  . Ran Out of Food in the Last Year: Not on file  Transportation Needs:   . Lack of Transportation (Medical): Not on file  . Lack of Transportation (Non-Medical): Not on file  Physical Activity:   . Days of Exercise per Week: Not on file  . Minutes of Exercise per Session: Not on file  Stress:   . Feeling of Stress : Not on file  Social Connections:   . Frequency of Communication with Friends and Family: Not on file  . Frequency of Social Gatherings with Friends and Family: Not on file  . Attends Religious Services: Not on file  . Active Member of Clubs or Organizations: Not on file  . Attends Archivist Meetings: Not on file  . Marital Status: Not on file  Intimate Partner Violence:   . Fear of Current or Ex-Partner: Not on file  . Emotionally Abused: Not on file  . Physically Abused: Not on file  . Sexually Abused: Not on file   Current Meds  Medication Sig  . levocetirizine (XYZAL) 5 MG tablet TAKE 1 TABLET(5 MG) BY MOUTH DAILY  . Levonorgestrel (KYLEENA) 19.5 MG IUD  by Intrauterine route.  Marland Kitchen levothyroxine (SYNTHROID) 100 MCG tablet Take 1 tablet by mouth daily.   No Known Allergies No results found for this or any previous visit (from the past 2160 hour(s)). Objective  Body mass index is 31.85 kg/m. Wt Readings from Last 3 Encounters:  07/16/20 203 lb 6.4 oz (92.3 kg)  05/12/20 (!) 204 lb 4.8 oz (92.7 kg)  07/13/19 203 lb (92.1 kg)   Temp Readings from Last 3 Encounters:  07/16/20 98.6 F (37 C)  05/12/20 (!) 97.2 F (36.2 C) (Temporal)  07/13/19 (!) 97.4 F (36.3 C) (Temporal)   BP Readings from Last 3 Encounters:  07/16/20 110/78  05/12/20 (!) 119/55  07/13/19 112/60   Pulse Readings from Last 3 Encounters:  07/16/20 89  05/12/20 71  07/13/19 71    Physical Exam Vitals and nursing note reviewed.  Constitutional:       Appearance: Normal appearance. She is well-developed and well-groomed. She is obese.  HENT:     Head: Normocephalic and atraumatic.  Eyes:     Conjunctiva/sclera: Conjunctivae normal.     Pupils: Pupils are equal, round, and reactive to light.  Cardiovascular:     Rate and Rhythm: Normal rate and regular rhythm.     Heart sounds: Normal heart sounds. No murmur heard.   Pulmonary:     Effort: Pulmonary effort is normal.     Breath sounds: Normal breath sounds.  Chest:     Chest wall: No mass.     Breasts: Breasts are symmetrical.        Right: Normal.        Left: Normal.  Abdominal:     General: Abdomen is flat. Bowel sounds are normal.  Lymphadenopathy:     Upper Body:     Right upper body: No axillary adenopathy.     Left upper body: No axillary adenopathy.  Skin:    General: Skin is warm and dry.  Neurological:     General: No focal deficit present.     Mental Status: She is alert and oriented to person, place, and time. Mental status is at baseline.     Gait: Gait normal.  Psychiatric:        Attention and Perception: Attention and perception normal.        Mood and Affect: Mood and affect normal.        Speech: Speech normal.        Behavior: Behavior normal. Behavior is cooperative.        Thought Content: Thought content normal.        Cognition and Memory: Cognition and memory normal.        Judgment: Judgment normal.     Assessment  Plan  Annual physical exam - Plan: Comprehensive metabolic panel, Lipid panel, CBC with Differential/Platelet, TSH, Urinalysis, Routine w reflex microscopic, Vitamin D (25 hydroxy)  fasting labs [facility]  flu shot at work9/29/21  Tdap 05/2017 per pt rec MMR not immune   Declined hep B status and HIV check  covid shot [facility] pfizer booster 6 months will get 07/20/20  Pap 07/21/17 neg pap neg HPV [facility]  mammo1/5/21 negative  -ordered not scheduled   IUD kyleena since 07/2016 exp 5 years no cycles but does have light  spotting  Pt will wait 07/2020  colonscopy will call back and let me know   rec exercise and healthy diet choices  Let me know when ready dermatology referral rec D3 2000-5000 Iu qd otc  per pt not compliant with this   Hypothyroidism, unspecified type - Plan: TSH Hyperlipidemia, unspecified hyperlipidemia type - Plan: Lipid panel Controlled tsh 01/2020 1.881 [facility] on levo 100 mcg qd Will f/u 10/2020  Vitamin D deficiency - Plan: Vitamin D (25 hydroxy) -she is taking Vit D   Hyperlipidemia, unspecified hyperlipidemia type -given info    Provider: Dr. Olivia Mackie McLean-Scocuzza-Internal Medicine

## 2020-07-25 DIAGNOSIS — Z1329 Encounter for screening for other suspected endocrine disorder: Secondary | ICD-10-CM | POA: Diagnosis not present

## 2020-07-25 DIAGNOSIS — E785 Hyperlipidemia, unspecified: Secondary | ICD-10-CM | POA: Diagnosis not present

## 2020-07-25 DIAGNOSIS — E039 Hypothyroidism, unspecified: Secondary | ICD-10-CM | POA: Diagnosis not present

## 2020-07-25 DIAGNOSIS — Z0000 Encounter for general adult medical examination without abnormal findings: Secondary | ICD-10-CM | POA: Diagnosis not present

## 2020-07-26 LAB — COMPREHENSIVE METABOLIC PANEL
ALT: 12 IU/L (ref 0–32)
AST: 14 IU/L (ref 0–40)
Albumin/Globulin Ratio: 2 (ref 1.2–2.2)
Albumin: 4.4 g/dL (ref 3.8–4.8)
Alkaline Phosphatase: 74 IU/L (ref 44–121)
BUN/Creatinine Ratio: 10 (ref 9–23)
BUN: 10 mg/dL (ref 6–24)
Bilirubin Total: 0.4 mg/dL (ref 0.0–1.2)
CO2: 25 mmol/L (ref 20–29)
Calcium: 9.3 mg/dL (ref 8.7–10.2)
Chloride: 100 mmol/L (ref 96–106)
Creatinine, Ser: 1 mg/dL (ref 0.57–1.00)
GFR calc Af Amer: 77 mL/min/{1.73_m2} (ref >59)
GFR calc non Af Amer: 67 mL/min/{1.73_m2} (ref >59)
Globulin, Total: 2.2 g/dL (ref 1.5–4.5)
Glucose: 86 mg/dL (ref 65–99)
Potassium: 4.4 mmol/L (ref 3.5–5.2)
Sodium: 138 mmol/L (ref 134–144)
Total Protein: 6.6 g/dL (ref 6.0–8.5)

## 2020-07-26 LAB — URINALYSIS, ROUTINE W REFLEX MICROSCOPIC
Bilirubin, UA: NEGATIVE
Glucose, UA: NEGATIVE
Ketones, UA: NEGATIVE
Leukocytes,UA: NEGATIVE
Nitrite, UA: NEGATIVE
Protein,UA: NEGATIVE
RBC, UA: NEGATIVE
Specific Gravity, UA: 1.018 (ref 1.005–1.030)
Urobilinogen, Ur: 0.2 mg/dL (ref 0.2–1.0)
pH, UA: 7.5 (ref 5.0–7.5)

## 2020-07-26 LAB — CBC WITH DIFFERENTIAL/PLATELET
Basophils Absolute: 0 10*3/uL (ref 0.0–0.2)
Basos: 1 %
EOS (ABSOLUTE): 0.2 10*3/uL (ref 0.0–0.4)
Eos: 5 %
Hematocrit: 39.4 % (ref 34.0–46.6)
Hemoglobin: 13.6 g/dL (ref 11.1–15.9)
Immature Grans (Abs): 0 10*3/uL (ref 0.0–0.1)
Immature Granulocytes: 0 %
Lymphocytes Absolute: 1.6 10*3/uL (ref 0.7–3.1)
Lymphs: 34 %
MCH: 31.4 pg (ref 26.6–33.0)
MCHC: 34.5 g/dL (ref 31.5–35.7)
MCV: 91 fL (ref 79–97)
Monocytes Absolute: 0.5 10*3/uL (ref 0.1–0.9)
Monocytes: 11 %
Neutrophils Absolute: 2.2 10*3/uL (ref 1.4–7.0)
Neutrophils: 49 %
Platelets: 216 10*3/uL (ref 150–450)
RBC: 4.33 x10E6/uL (ref 3.77–5.28)
RDW: 12.1 % (ref 11.7–15.4)
WBC: 4.5 10*3/uL (ref 3.4–10.8)

## 2020-07-26 LAB — LIPID PANEL
Chol/HDL Ratio: 5.8 ratio — ABNORMAL HIGH (ref 0.0–4.4)
Cholesterol, Total: 237 mg/dL — ABNORMAL HIGH (ref 100–199)
HDL: 41 mg/dL (ref >39)
LDL Chol Calc (NIH): 179 mg/dL — ABNORMAL HIGH (ref 0–99)
Triglycerides: 95 mg/dL (ref 0–149)
VLDL Cholesterol Cal: 17 mg/dL (ref 5–40)

## 2020-07-26 LAB — HEPATITIS C ANTIBODY: Hep C Virus Ab: <0.1 s/co ratio (ref 0.0–0.9)

## 2020-07-26 LAB — VITAMIN D 25 HYDROXY (VIT D DEFICIENCY, FRACTURES): Vit D, 25-Hydroxy: 26.4 ng/mL — ABNORMAL LOW (ref 30.0–100.0)

## 2020-07-26 LAB — TSH: TSH: 1.7 u[IU]/mL (ref 0.450–4.500)

## 2020-09-11 ENCOUNTER — Encounter: Payer: Self-pay | Admitting: Internal Medicine

## 2020-12-16 ENCOUNTER — Other Ambulatory Visit: Payer: Self-pay

## 2020-12-16 ENCOUNTER — Ambulatory Visit
Admission: RE | Admit: 2020-12-16 | Discharge: 2020-12-16 | Disposition: A | Discharge status: Home / Self Care | Payer: BC Managed Care – PPO | Source: Ambulatory Visit | Attending: Internal Medicine | Admitting: Internal Medicine

## 2020-12-16 DIAGNOSIS — Z1231 Encounter for screening mammogram for malignant neoplasm of breast: Secondary | ICD-10-CM

## 2021-01-19 ENCOUNTER — Telehealth: Payer: Self-pay

## 2021-01-19 NOTE — Telephone Encounter (Signed)
Pt called and states that she is scheduled to get a colonoscopy. She wants to get it scheduled during the summer when she is off and wants to go where her husband goes. She did not the know the name of the place but states that she will figure it out before she gets a call back.

## 2021-01-21 ENCOUNTER — Encounter: Payer: Self-pay | Admitting: Internal Medicine

## 2021-01-21 NOTE — Telephone Encounter (Signed)
Called and spoke with the Patient. She states she would like to ask Dr French Ana McLean-Scocuzza's opinion on the provider she wants to see.  Patient states she did not have the name on her at the moment. She will send this via mychart.   Patient verbalized understanding that Dr French Ana McLean-Scocuzza is out of office until 01/27/21 and is willing to wait until she comes back in for her opinion and referral

## 2021-01-21 NOTE — Telephone Encounter (Addendum)
Per 01/19/21 telephone encounter Patient is aware Dr French Ana McLean-Scocuzza is out of office and is okay to wait for her return.   Dr French Ana McLean-Scocuzza, Please advise

## 2021-01-26 DIAGNOSIS — Z113 Encounter for screening for infections with a predominantly sexual mode of transmission: Secondary | ICD-10-CM | POA: Diagnosis not present

## 2021-01-26 DIAGNOSIS — Z01419 Encounter for gynecological examination (general) (routine) without abnormal findings: Secondary | ICD-10-CM | POA: Diagnosis not present

## 2021-01-26 DIAGNOSIS — Z6832 Body mass index (BMI) 32.0-32.9, adult: Secondary | ICD-10-CM | POA: Diagnosis not present

## 2021-01-26 LAB — HM PAP SMEAR: HM Pap smear: NEGATIVE

## 2021-01-26 NOTE — Addendum Note (Signed)
Addended by: Quentin Ore on: 01/26/2021 09:19 PM   Modules accepted: Orders

## 2021-01-27 ENCOUNTER — Encounter: Payer: Self-pay | Admitting: Gastroenterology

## 2021-02-02 DIAGNOSIS — E039 Hypothyroidism, unspecified: Secondary | ICD-10-CM | POA: Diagnosis not present

## 2021-02-06 DIAGNOSIS — E039 Hypothyroidism, unspecified: Secondary | ICD-10-CM | POA: Diagnosis not present

## 2021-02-06 DIAGNOSIS — E559 Vitamin D deficiency, unspecified: Secondary | ICD-10-CM | POA: Diagnosis not present

## 2021-02-11 DIAGNOSIS — Z3202 Encounter for pregnancy test, result negative: Secondary | ICD-10-CM | POA: Diagnosis not present

## 2021-02-11 DIAGNOSIS — Z30433 Encounter for removal and reinsertion of intrauterine contraceptive device: Secondary | ICD-10-CM | POA: Diagnosis not present

## 2021-03-19 ENCOUNTER — Encounter: Payer: Self-pay | Admitting: Internal Medicine

## 2021-03-19 ENCOUNTER — Telehealth (INDEPENDENT_AMBULATORY_CARE_PROVIDER_SITE_OTHER): Payer: BC Managed Care – PPO | Admitting: Adult Health

## 2021-03-19 ENCOUNTER — Encounter: Payer: Self-pay | Admitting: Adult Health

## 2021-03-19 VITALS — Ht 67.01 in

## 2021-03-19 DIAGNOSIS — R399 Unspecified symptoms and signs involving the genitourinary system: Secondary | ICD-10-CM | POA: Diagnosis not present

## 2021-03-19 MED ORDER — CEPHALEXIN 500 MG PO CAPS: 500.0000 mg | ORAL_CAPSULE | Freq: Three times a day (TID) | ORAL | 0 refills | Status: DC

## 2021-03-19 NOTE — Progress Notes (Signed)
Virtual Visit via Video Note  I connected with Stephanie Edwards on 03/19/21 at  3:00 PM EDT by a video enabled telemedicine application and verified that I am speaking with the correct person using two identifiers.   Parties involved in visit as below:    Location: Patient: at home  Provider: Provider: Provider's office at  [facility], [place].      I discussed the limitations of evaluation and management by telemedicine and the availability of in person appointments. The patient expressed understanding and agreed to proceed.  History of Present Illness:  Patient is a 50 year old female in no acute distress for urgency, decreased urination, and today she is drinking more but having discomfort with urination.  Denies any history of kidney stone.  Denies any abdominal pain.  Denies any hematuria.  Patient  denies any fever, body aches,chills, rash, chest pain, shortness of breath, nausea, vomiting, or diarrhea.    Last covid booster 07/20/2020    Observations/Objective:   Patient is alert and oriented and responsive to questions Engages in conversation with provider. Speaks in full sentences without any pauses without any shortness of breath or distress.   Assessment and Plan:  1. Urinary symptom or sign She will hold antibiotic until after urine culture on 03/20/21. She will hold  - Urine Microscopic Only; Future - CULTURE, URINE COMPREHENSIVE; Future - cephALEXin (KEFLEX) 500 MG capsule; Take 1 capsule (500 mg total) by mouth 3 (three) times daily.  Dispense: 15 capsule; Refill: 0 Follow Up Instructions: Return in about 1 week (around 03/26/2021), or if symptoms worsen or fail to improve, for at any time for any worsening symptoms, Go to Emergency room/ urgent care if worse.    I discussed the assessment and treatment plan with the patient. The patient was provided an opportunity to ask questions and all were answered. The patient agreed with the  plan and demonstrated an understanding of the instructions.   The patient was advised to call back or seek an in-person evaluation if the symptoms worsen or if the condition fails to improve as anticipated. Advised in person evaluation at anytime is advised if any symptoms do not improve, worsen or change at any given time.  Red Flags discussed. The patient was given clear instructions to go to ER or return to medical center if any red flags develop, symptoms do not improve, worsen or new problems develop. They verbalized understanding.    Jairo Ben, FNP

## 2021-03-19 NOTE — Telephone Encounter (Signed)
Patient scheduled with Marcelino Duster today. Requested to do a virtual and will come in for urine if needed.

## 2021-03-19 NOTE — Patient Instructions (Signed)
Cephalexin Tablets or Capsules What is this medicine? CEPHALEXIN (sef a LEX in) is a cephalosporin antibiotic. It treats some infections caused by bacteria. It will not work for colds, the flu, or other viruses. This medicine may be used for other purposes; ask your health care provider or pharmacist if you have questions. COMMON BRAND NAME(S): Biocef, Daxbia, Keflex, Keftab What should I tell my health care provider before I take this medicine? They need to know if you have any of these conditions:  bleeding disorder  kidney disease  liver disease  seizures  stomach or intestine problems like colitis  an unusual or allergic reaction to cephalexin, other penicillin or cephalosporin antibiotics, other medicines, foods, dyes, or preservatives  pregnant or trying to get pregnant  breast-feeding How should I use this medicine? Take this drug by mouth. Take it as directed on the prescription label at the same time every day. You can take it with or without food. If it upsets your stomach, take it with food. Take all of this drug unless your health care provider tells you to stop it early. Keep taking it even if you think you are better. Talk to your health care provider about the use of this drug in children. While it may be prescribed for selected conditions, precautions do apply. Overdosage: If you think you have taken too much of this medicine contact a poison control center or emergency room at once. NOTE: This medicine is only for you. Do not share this medicine with others. What if I miss a dose? If you miss a dose, take it as soon as you can. If it is almost time for your next dose, take only that dose. Do not take double or extra doses. What may interact with this medicine?  probenecid  some other antibiotics This list may not describe all possible interactions. Give your health care provider a list of all the medicines, herbs, non-prescription drugs, or dietary supplements you  use. Also tell them if you smoke, drink alcohol, or use illegal drugs. Some items may interact with your medicine. What should I watch for while using this medicine? Tell your health care provider if your symptoms do not start to get better or if they get worse. Do not treat diarrhea with over the counter products. Contact your health care provider if you have diarrhea that lasts more than 2 days or if it is severe and watery. This medicine may cause serious skin reactions. They can happen weeks to months after starting the medicine. Contact your health care provider right away if you notice fevers or flu-like symptoms with a rash. The rash may be red or purple and then turn into blisters or peeling of the skin. Or, you might notice a red rash with swelling of the face, lips or lymph nodes in your neck or under your arms. If you have diabetes, you may get a false-positive result for sugar in your urine. Check with your health care provider. What side effects may I notice from receiving this medicine? Side effects that you should report to your doctor or health care provider as soon as possible:  allergic reactions (skin rash, itching or hives; swelling of the face, lips, or tongue)  bloody or watery diarrhea  fever  kidney injury (trouble passing urine or change in the amount of urine)  low red blood cell counts (trouble breathing; feeling faint; lightheaded, falls; unusually weak or tired)  redness, blistering, peeling, or loosening of the skin, including inside   the mouth  unusual bruising or bleeding Side effects that usually do not require medical attention (report to your doctor or health care provider if they continue or are bothersome):  headache  dizziness  nausea, vomiting  unusual vaginal discharge, itching, or odor  upset stomach This list may not describe all possible side effects. Call your doctor for medical advice about side effects. You may report side effects to FDA  at 1-800-FDA-1088. Where should I keep my medicine? Keep out of the reach of children and pets. Store at room temperature between 20 and 25 degrees C (68 and 77 degrees F). Throw away any unused drug after the expiration date. NOTE: This sheet is a summary. It may not cover all possible information. If you have questions about this medicine, talk to your doctor, pharmacist, or health care provider.  2021 Elsevier/Gold Standard (2019-08-02 15:26:31) Urinary Tract Infection, Adult A urinary tract infection (UTI) is an infection of any part of the urinary tract. The urinary tract includes:  The kidneys.  The ureters.  The bladder.  The urethra. These organs make, store, and get rid of pee (urine) in the body. What are the causes? This infection is caused by germs (bacteria) in your genital area. These germs grow and cause swelling (inflammation) of your urinary tract. What increases the risk? The following factors may make you more likely to develop this condition:  Using a small, thin tube (catheter) to drain pee.  Not being able to control when you pee or poop (incontinence).  Being female. If you are female, these things can increase the risk: ? Using these methods to prevent pregnancy:  A medicine that kills sperm (spermicide).  A device that blocks sperm (diaphragm). ? Having low levels of a female hormone (estrogen). ? Being pregnant. You are more likely to develop this condition if:  You have genes that add to your risk.  You are sexually active.  You take antibiotic medicines.  You have trouble peeing because of: ? A prostate that is bigger than normal, if you are female. ? A blockage in the part of your body that drains pee from the bladder. ? A kidney stone. ? A nerve condition that affects your bladder. ? Not getting enough to drink. ? Not peeing often enough.  You have other conditions, such as: ? Diabetes. ? A weak disease-fighting system (immune  system). ? Sickle cell disease. ? Gout. ? Injury of the spine. What are the signs or symptoms? Symptoms of this condition include:  Needing to pee right away.  Peeing small amounts often.  Pain or burning when peeing.  Blood in the pee.  Pee that smells bad or not like normal.  Trouble peeing.  Pee that is cloudy.  Fluid coming from the vagina, if you are female.  Pain in the belly or lower back. Other symptoms include:  Vomiting.  Not feeling hungry.  Feeling mixed up (confused). This may be the first symptom in older adults.  Being tired and grouchy (irritable).  A fever.  Watery poop (diarrhea). How is this treated?  Taking antibiotic medicine.  Taking other medicines.  Drinking enough water. In some cases, you may need to see a specialist. Follow these instructions at home: Medicines  Take over-the-counter and prescription medicines only as told by your doctor.  If you were prescribed an antibiotic medicine, take it as told by your doctor. Do not stop taking it even if you start to feel better. General instructions  Make sure   you: ? Pee until your bladder is empty. ? Do not hold pee for a long time. ? Empty your bladder after sex. ? Wipe from front to back after peeing or pooping if you are a female. Use each tissue one time when you wipe.  Drink enough fluid to keep your pee pale yellow.  Keep all follow-up visits.   Contact a doctor if:  You do not get better after 1-2 days.  Your symptoms go away and then come back. Get help right away if:  You have very bad back pain.  You have very bad pain in your lower belly.  You have a fever.  You have chills.  You feeling like you will vomit or you vomit. Summary  A urinary tract infection (UTI) is an infection of any part of the urinary tract.  This condition is caused by germs in your genital area.  There are many risk factors for a UTI.  Treatment includes antibiotic  medicines.  Drink enough fluid to keep your pee pale yellow. This information is not intended to replace advice given to you by your health care provider. Make sure you discuss any questions you have with your health care provider. Document Revised: 05/09/2020 Document Reviewed: 05/09/2020 Elsevier Patient Education  2021 Elsevier Inc.  

## 2021-03-19 NOTE — Progress Notes (Signed)
Pt has been scheduled for lab appt on 6/10

## 2021-03-20 ENCOUNTER — Other Ambulatory Visit: Payer: BC Managed Care – PPO

## 2021-03-20 ENCOUNTER — Other Ambulatory Visit: Payer: Self-pay

## 2021-03-20 DIAGNOSIS — R399 Unspecified symptoms and signs involving the genitourinary system: Secondary | ICD-10-CM

## 2021-03-20 LAB — URINALYSIS, MICROSCOPIC ONLY

## 2021-03-20 NOTE — Addendum Note (Signed)
Addended by: Warden Fillers on: 03/20/2021 03:16 PM   Modules accepted: Orders

## 2021-03-20 NOTE — Addendum Note (Signed)
Addended by: Warden Fillers on: 03/20/2021 08:03 AM   Modules accepted: Orders

## 2021-03-23 LAB — CULTURE, URINE COMPREHENSIVE
MICRO NUMBER:: 11993968
SPECIMEN QUALITY:: ADEQUATE

## 2021-03-24 NOTE — Progress Notes (Signed)
Early urinary tract infection per urine culture with gram positive cocci, recommend completing Keflex as prescribed and returning to office or be seen in person  if any symptoms are worsening at anytime.

## 2021-03-28 ENCOUNTER — Encounter: Payer: Self-pay | Admitting: Internal Medicine

## 2021-03-30 DIAGNOSIS — Z3040 Encounter for surveillance of contraceptives, unspecified: Secondary | ICD-10-CM | POA: Diagnosis not present

## 2021-03-30 DIAGNOSIS — Z3009 Encounter for other general counseling and advice on contraception: Secondary | ICD-10-CM | POA: Diagnosis not present

## 2021-03-30 DIAGNOSIS — Z7689 Persons encountering health services in other specified circumstances: Secondary | ICD-10-CM | POA: Diagnosis not present

## 2021-04-07 ENCOUNTER — Encounter: Payer: Self-pay | Admitting: Internal Medicine

## 2021-04-15 ENCOUNTER — Other Ambulatory Visit: Payer: Self-pay

## 2021-04-15 ENCOUNTER — Ambulatory Visit (AMBULATORY_SURGERY_CENTER): Payer: BC Managed Care – PPO | Admitting: *Deleted

## 2021-04-15 VITALS — Ht 67.0 in | Wt 204.6 lb

## 2021-04-15 DIAGNOSIS — Z1211 Encounter for screening for malignant neoplasm of colon: Secondary | ICD-10-CM

## 2021-04-15 MED ORDER — SUPREP BOWEL PREP KIT 17.5-3.13-1.6 GM/177ML PO SOLN: 1.0000 | Freq: Once | ORAL | 0 refills | Status: AC

## 2021-04-15 NOTE — Progress Notes (Signed)
No surgical history, denies trouble moving neck, no fam hx of malignant hyperthermia  Pt is not taking Phentermine at this time    No egg or soy allergy  No home oxygen use   No medications for weight loss taken  Pt denies constipation issues  Pt informed that we do not do prior authorizations for prep

## 2021-04-27 ENCOUNTER — Encounter: Payer: Self-pay | Admitting: Gastroenterology

## 2021-04-29 ENCOUNTER — Ambulatory Visit (AMBULATORY_SURGERY_CENTER): Payer: BC Managed Care – PPO | Admitting: Gastroenterology

## 2021-04-29 ENCOUNTER — Other Ambulatory Visit: Payer: Self-pay

## 2021-04-29 ENCOUNTER — Encounter: Payer: Self-pay | Admitting: Gastroenterology

## 2021-04-29 VITALS — BP 114/51 | HR 64 | Temp 97.3°F | Resp 19 | Ht 67.0 in | Wt 204.0 lb

## 2021-04-29 DIAGNOSIS — Z1211 Encounter for screening for malignant neoplasm of colon: Secondary | ICD-10-CM

## 2021-04-29 DIAGNOSIS — K620 Anal polyp: Secondary | ICD-10-CM

## 2021-04-29 DIAGNOSIS — K635 Polyp of colon: Secondary | ICD-10-CM

## 2021-04-29 DIAGNOSIS — K629 Disease of anus and rectum, unspecified: Secondary | ICD-10-CM

## 2021-04-29 MED ORDER — SODIUM CHLORIDE 0.9 % IV SOLN: 500.0000 mL | Freq: Once | INTRAVENOUS | Status: DC

## 2021-04-29 NOTE — Progress Notes (Signed)
Report given to PACU, vss 

## 2021-04-29 NOTE — Patient Instructions (Signed)
Handouts given:  polyps, diverticulosis, hemorrhoids Resume previous diet Continue current medications Await pathology results  YOU HAD AN ENDOSCOPIC PROCEDURE TODAY AT THE [facility]:   Refer to the procedure report that was given to you for any specific questions about what was found during the examination.  If the procedure report does not answer your questions, please call your gastroenterologist to clarify.  If you requested that your care partner not be given the details of your procedure findings, then the procedure report has been included in a sealed envelope for you to review at your convenience later.  YOU SHOULD EXPECT: Some feelings of bloating in the abdomen. Passage of more gas than usual.  Walking can help get rid of the air that was put into your GI tract during the procedure and reduce the bloating. If you had a lower endoscopy (such as a colonoscopy or flexible sigmoidoscopy) you may notice spotting of blood in your stool or on the toilet paper. If you underwent a bowel prep for your procedure, you may not have a normal bowel movement for a few days.  Please Note:  You might notice some irritation and congestion in your nose or some drainage.  This is from the oxygen used during your procedure.  There is no need for concern and it should clear up in a day or so.  SYMPTOMS TO REPORT IMMEDIATELY:  Following lower endoscopy (colonoscopy or flexible sigmoidoscopy):  Excessive amounts of blood in the stool  Significant tenderness or worsening of abdominal pains  Swelling of the abdomen that is new, acute  Fever of 100F or higher  For urgent or emergent issues, a gastroenterologist can be reached at any hour by calling [phone]. Do not use MyChart messaging for urgent concerns.   DIET:  We do recommend a small meal at first, but then you may proceed to your regular diet.  Drink plenty of fluids but you should avoid alcoholic beverages for 24 hours.  ACTIVITY:   You should plan to take it easy for the rest of today and you should NOT DRIVE or use heavy machinery until tomorrow (because of the sedation medicines used during the test).    FOLLOW UP: Our staff will call the number listed on your records 48-72 hours following your procedure to check on you and address any questions or concerns that you may have regarding the information given to you following your procedure. If we do not reach you, we will leave a message.  We will attempt to reach you two times.  During this call, we will ask if you have developed any symptoms of COVID 19. If you develop any symptoms (ie: fever, flu-like symptoms, shortness of breath, cough etc.) before then, please call [phone].  If you test positive for Covid 19 in the 2 weeks post procedure, please call and report this information to us.    If any biopsies were taken you will be contacted by phone or by letter within the next 1-3 weeks.  Please call us at [phone] if you have not heard about the biopsies in 3 weeks.   SIGNATURES/CONFIDENTIALITY: You and/or your care partner have signed paperwork which will be entered into your electronic medical record.  These signatures attest to the fact that that the information above on your After Visit Summary has been reviewed and is understood.  Full responsibility of the confidentiality of this discharge information lies with you and/or your care-partner.  

## 2021-04-29 NOTE — Op Note (Signed)
[facility] Patient Name: Stephanie Edwards Procedure Date: 04/29/2021 11:12 AM MRN: [id number] Endoscopist: Napoleon FormKavitha V. Celeste Candelas , MD Age: 50 Referring MD:  Date of Birth: 04-Dec-1970 Gender: Female Account #: [id number] Procedure:                Colonoscopy Indications:              Screening for colorectal malignant neoplasm Medicines:                Monitored Anesthesia Care Procedure:                Pre-Anesthesia Assessment:                           - Prior to the procedure, a History and Physical                            was performed, and patient medications and                            allergies were reviewed. The patient's tolerance of                            previous anesthesia was also reviewed. The risks                            and benefits of the procedure and the sedation                            options and risks were discussed with the patient.                            All questions were answered, and informed consent                            was obtained. Prior Anticoagulants: The patient has                            taken no previous anticoagulant or antiplatelet                            agents. ASA Grade Assessment: II - A patient with                            mild systemic disease. After reviewing the risks                            and benefits, the patient was deemed in                            satisfactory condition to undergo the procedure.                           After obtaining informed consent, the colonoscope  was passed under direct vision. Throughout the                            procedure, the patient's blood pressure, pulse, and                            oxygen saturations were monitored continuously. The                            [id number]-HQ190L Colonoscope was introduced through the                            anus and advanced to the the cecum, identified by                             appendiceal orifice and ileocecal valve. The                            colonoscopy was performed without difficulty. The                            patient tolerated the procedure well. The quality                            of the bowel preparation was excellent. The                            ileocecal valve, appendiceal orifice, and rectum                            were photographed. Scope In: 11:20:45 AM Scope Out: 11:41:17 AM Scope Withdrawal Time: 0 hours 8 minutes 52 seconds  Total Procedure Duration: 0 hours 20 minutes 32 seconds  Findings:                 The perianal and digital rectal examinations were                            normal.                           A 5 mm polyp was found in the hepatic flexure. The                            polyp was sessile. The polyp was removed with a                            cold snare. Resection and retrieval were complete.                           A less than 1 mm polyp was found in the anus. The                            polyp was sessile. The polyp was removed with  a                            cold biopsy forceps. Resection and retrieval were                            complete.                           Scattered small-mouthed diverticula were found in                            the sigmoid colon, descending colon, transverse                            colon and ascending colon.                           Non-bleeding internal hemorrhoids were found during                            retroflexion. The hemorrhoids were small. Complications:            No immediate complications. Estimated Blood Loss:     Estimated blood loss was minimal. Impression:               - One 5 mm polyp at the hepatic flexure, removed                            with a cold snare. Resected and retrieved.                           - One less than 1 mm polyp at the anus, removed                            with a cold biopsy forceps. Resected and retrieved.                            - Diverticulosis in the sigmoid colon, in the                            descending colon, in the transverse colon and in                            the ascending colon.                           - Non-bleeding internal hemorrhoids. Recommendation:           - Patient has a contact number available for                            emergencies. The signs and symptoms of potential                            delayed complications were discussed with the  patient. Return to normal activities tomorrow.                            Written discharge instructions were provided to the                            patient.                           - Resume previous diet.                           - Continue present medications.                           - Await pathology results.                           - Repeat colonoscopy in 5-10 years for surveillance                            based on pathology results. Napoleon Form, MD 04/29/2021 11:50:27 AM This report has been signed electronically.

## 2021-04-29 NOTE — Progress Notes (Signed)
Called to room to assist during endoscopic procedure.  Patient ID and intended procedure confirmed with present staff. Received instructions for my participation in the procedure from the performing physician.  

## 2021-04-29 NOTE — Progress Notes (Signed)
Pt's states no medical or surgical changes since previsit or office visit. VS by [place] 

## 2021-05-01 ENCOUNTER — Telehealth: Payer: Self-pay

## 2021-05-01 NOTE — Telephone Encounter (Signed)
LVM

## 2021-05-06 ENCOUNTER — Telehealth: Payer: Self-pay | Admitting: Gastroenterology

## 2021-05-06 NOTE — Telephone Encounter (Signed)
Patient called inquiring about path results as she can see them on the pt portal. She was informed that we will call her as soon as Dr. Lavon Paganini reviews them.

## 2021-05-07 ENCOUNTER — Telehealth: Payer: BC Managed Care – PPO | Admitting: Physician Assistant

## 2021-05-07 DIAGNOSIS — U071 COVID-19: Secondary | ICD-10-CM | POA: Diagnosis not present

## 2021-05-07 MED ORDER — BENZONATATE 100 MG PO CAPS: 100.0000 mg | ORAL_CAPSULE | Freq: Three times a day (TID) | ORAL | 0 refills | Status: AC

## 2021-05-07 MED ORDER — FLUTICASONE PROPIONATE 50 MCG/ACT NA SUSP: 2.0000 | Freq: Every day | NASAL | 0 refills | Status: AC

## 2021-05-07 MED ORDER — GUAIFENESIN ER 600 MG PO TB12: 600.0000 mg | ORAL_TABLET | Freq: Two times a day (BID) | ORAL | 0 refills | Status: AC

## 2021-05-07 MED ORDER — MOLNUPIRAVIR EUA 200MG CAPSULE: 4.0000 | ORAL_CAPSULE | Freq: Two times a day (BID) | ORAL | 0 refills | Status: AC

## 2021-05-07 NOTE — Progress Notes (Signed)
Ms. Stephanie Edwards, Stephanie Edwards are scheduled for a virtual visit with your provider today.    Just as we do with appointments in the office, we must obtain your consent to participate.  Your consent will be active for this visit and any virtual visit you may have with one of our providers in the next 365 days.    If you have a MyChart account, I can also send a copy of this consent to you electronically.  All virtual visits are billed to your insurance company just like a traditional visit in the office.  As this is a virtual visit, video technology does not allow for your provider to perform a traditional examination.  This may limit your provider's ability to fully assess your condition.  If your provider identifies any concerns that need to be evaluated in person or the need to arrange testing such as labs, EKG, etc, we will make arrangements to do so.    Although advances in technology are sophisticated, we cannot ensure that it will always work on either your end or our end.  If the connection with a video visit is poor, we may have to switch to a telephone visit.  With either a video or telephone visit, we are not always able to ensure that we have a secure connection.   I need to obtain your verbal consent now.   Are you willing to proceed with your visit today?   Stephanie Edwards has provided verbal consent on 05/07/2021 for a virtual visit (video or telephone).   Stephanie Meres, PA-C 05/07/2021  4:47 PM  Stephanie Edwards,you are scheduled for a virtual visit with your provider today.    Just as we do with appointments in the office, we must obtain your consent to participate.  Your consent will be active for this visit and any virtual visit you may have with one of our providers in the next 365 days.    If you have a MyChart account, I can also send a copy of this consent to you electronically.  All virtual visits are billed to your insurance company just like a traditional visit in the office.  As this is a  virtual visit, video technology does not allow for your provider to perform a traditional examination.  This may limit your provider's ability to fully assess your condition.  If your provider identifies any concerns that need to be evaluated in person or the need to arrange testing such as labs, EKG, etc, we will make arrangements to do so.    Although advances in technology are sophisticated, we cannot ensure that it will always work on either your end or our end.  If the connection with a video visit is poor, we may have to switch to a telephone visit.  With either a video or telephone visit, we are not always able to ensure that we have a secure connection.   I need to obtain your verbal consent now.   Are you willing to proceed with your visit today?   Stephanie Edwards has provided verbal consent on 05/07/2021 for a virtual visit (video or telephone).   Stephanie Edwards, [place] 05/07/2021  4:47 PM   Date:  05/07/2021   ID:  Stephanie Edwards, DOB [birth year], MRN [id number]  Patient Location: Home Provider Location: Home Office   Participants: Patient and Provider for Visit and Wrap up  Method of visit: Video  Location of Patient: Home Location of Provider: Home Office Consent was obtain for visit  over the video. Services rendered by provider: Visit was performed via video  A video enabled telemedicine application was used and I verified that I am speaking with the correct person using two identifiers.  PCP:  McLean-Scocuzza, Pasty Spillers, MD   Chief Complaint:  covid positive  History of Present Illness:    Stephanie Edwards is a 50 y.o. female with history as stated below. Presents video telehealth for an acute care visit  Onset of symptoms was 3 days ago and symptoms have been persistent and include:      Pt is c/o sore throat, rhinorrhea, cough, headache. Denies documented fevers, cp, sob, nvd. She did have some acid reflux.  Pt took a COVID test today and it was positive.  No other  aggravating or relieving factors.  No other c/o.  Past Medical, Surgical, Social History, Allergies, and Medications have been Reviewed.  Past Medical History:  Diagnosis Date   Allergy    Anxiety    GERD (gastroesophageal reflux disease)    High cholesterol    no meds taken at this time   Thyroid disease    Vaginal Pap smear, abnormal 2000   Vitamin D deficiency    Vitamin D deficiency     Current Meds  Medication Sig   benzonatate (TESSALON) 100 MG capsule Take 1 capsule (100 mg total) by mouth every 8 (eight) hours for 5 days.   fluticasone (FLONASE) 50 MCG/ACT nasal spray Place 2 sprays into both nostrils daily.   guaiFENesin (MUCINEX) 600 MG 12 hr tablet Take 1 tablet (600 mg total) by mouth 2 (two) times daily.   molnupiravir EUA 200 mg CAPS Take 4 capsules (800 mg total) by mouth 2 (two) times daily for 5 days.     Allergies:   Patient has no known allergies.   ROS See HPI for history of present illness.  Physical Exam HENT:     Head: Normocephalic.     Nose:     Comments: Sounds congested Eyes:     Conjunctiva/sclera: Conjunctivae normal.  Pulmonary:     Effort: Pulmonary effort is normal.     Comments: Speaking in paragraphs without distress Neurological:     Mental Status: She is alert.             MDM: Pt tested positive for covid. Is nontoxic appearing and has no red flag sxs that would warrant the need for urgent in person eval. Supportive care and molnupirovir given.   There are no diagnoses linked to this encounter.   Time:   Today, I have spent 15 minutes with the patient with telehealth technology discussing the above problems, reviewing the chart, previous notes, medications and orders.    Tests Ordered: No orders of the defined types were placed in this encounter.   Medication Changes: Meds ordered this encounter  Medications   molnupiravir EUA 200 mg CAPS    Sig: Take 4 capsules (800 mg total) by mouth 2 (two) times daily for 5 days.     Dispense:  40 capsule    Refill:  0    Order Specific Question:   Supervising Provider    Answer:   MILLER, BRIAN [[id number]]   guaiFENesin (MUCINEX) 600 MG 12 hr tablet    Sig: Take 1 tablet (600 mg total) by mouth 2 (two) times daily.    Dispense:  6 tablet    Refill:  0    Order Specific Question:   Supervising Provider    Answer:  MILLER, BRIAN [[id number]]   fluticasone (FLONASE) 50 MCG/ACT nasal spray    Sig: Place 2 sprays into both nostrils daily.    Dispense:  16 g    Refill:  0    Order Specific Question:   Supervising Provider    Answer:   MILLER, BRIAN [[id number]]   benzonatate (TESSALON) 100 MG capsule    Sig: Take 1 capsule (100 mg total) by mouth every 8 (eight) hours for 5 days.    Dispense:  15 capsule    Refill:  0    Order Specific Question:   Supervising Provider    Answer:   Eber Hong [[id number]]     Disposition:  Follow up  Signed, Stephanie Meres, PA-C  05/07/2021 4:47 PM

## 2021-05-07 NOTE — Patient Instructions (Signed)
Stephanie Edwards, thank you for joining Karrie Meres, PA-C for today's virtual visit.  While this provider is not your primary care provider (PCP), if your PCP is located in our provider database this encounter information will be shared with them immediately following your visit.  Consent: (Patient) Stephanie Edwards provided verbal consent for this virtual visit at the beginning of the encounter.  Current Medications:  Current Outpatient Medications:    benzonatate (TESSALON) 100 MG capsule, Take 1 capsule (100 mg total) by mouth every 8 (eight) hours for 5 days., Disp: 15 capsule, Rfl: 0   fluticasone (FLONASE) 50 MCG/ACT nasal spray, Place 2 sprays into both nostrils daily., Disp: 16 g, Rfl: 0   guaiFENesin (MUCINEX) 600 MG 12 hr tablet, Take 1 tablet (600 mg total) by mouth 2 (two) times daily., Disp: 6 tablet, Rfl: 0   molnupiravir EUA 200 mg CAPS, Take 4 capsules (800 mg total) by mouth 2 (two) times daily for 5 days., Disp: 40 capsule, Rfl: 0   levocetirizine (XYZAL) 5 MG tablet, TAKE 1 TABLET(5 MG) BY MOUTH DAILY, Disp: 90 tablet, Rfl: 3   levonorgestrel (KYLEENA) 19.5 MG IUD, by Intrauterine route., Disp: , Rfl:    levothyroxine (SYNTHROID) 100 MCG tablet, Take 1 tablet by mouth daily., Disp: , Rfl:    levothyroxine (SYNTHROID) 100 MCG tablet, Take 100 mcg by mouth daily before breakfast., Disp: , Rfl:    phentermine (ADIPEX-P) 37.5 MG tablet, Take 37.5 mg by mouth daily as needed. (Patient not taking: No sig reported), Disp: , Rfl:    Medications ordered in this encounter:  Meds ordered this encounter  Medications   molnupiravir EUA 200 mg CAPS    Sig: Take 4 capsules (800 mg total) by mouth 2 (two) times daily for 5 days.    Dispense:  40 capsule    Refill:  0    Order Specific Question:   Supervising Provider    Answer:   MILLER, BRIAN [[id number]]   guaiFENesin (MUCINEX) 600 MG 12 hr tablet    Sig: Take 1 tablet (600 mg total) by mouth 2 (two) times daily.    Dispense:  6 tablet     Refill:  0    Order Specific Question:   Supervising Provider    Answer:   MILLER, BRIAN [[id number]]   fluticasone (FLONASE) 50 MCG/ACT nasal spray    Sig: Place 2 sprays into both nostrils daily.    Dispense:  16 g    Refill:  0    Order Specific Question:   Supervising Provider    Answer:   MILLER, BRIAN [[id number]]   benzonatate (TESSALON) 100 MG capsule    Sig: Take 1 capsule (100 mg total) by mouth every 8 (eight) hours for 5 days.    Dispense:  15 capsule    Refill:  0    Order Specific Question:   Supervising Provider    Answer:   Hyacinth Meeker, BRIAN [[id number]]     *If you need refills on other medications prior to your next appointment, please contact your pharmacy*  Follow-Up: Call back or seek an in-person evaluation if the symptoms worsen or if the condition fails to improve as anticipated. COVID-19: What to Do if You Are Sick CDC has updated isolation and quarantine recommendations for the public, and is revising the CDC website to reflect these changes. These recommendations do not apply to healthcare personnel and do not supersede state, local, tribal, or territorial laws, rules, andregulations. If you have a fever,  cough or other symptoms, you might have COVID-19. Most people have mild illness and are able to recover at home. If you are sick: Keep track of your symptoms. If you have an emergency warning sign (including trouble breathing), call 911. Steps to help prevent the spread of COVID-19 if you are sick If you are sick with COVID-19 or think you might have COVID-19, follow the steps below to care for yourself and to help protect other peoplein your home and community. Stay home except to get medical care Stay home. Most people with COVID-19 have mild illness and can recover at home without medical care. Do not leave your home, except to get medical care. Do not visit public areas. Take care of yourself. Get rest and stay hydrated. Take over-the-counter medicines, such as  acetaminophen, to help you feel better. Stay in touch with your doctor. Call before you get medical care. Be sure to get care if you have trouble breathing, or have any other emergency warning signs, or if you think it is an emergency. Avoid public transportation, ride-sharing, or taxis. Separate yourself from other people As much as possible, stay in a specific room and away from other people and pets in your home. If possible, you should use a separate bathroom. If you need to be around other people or animals in oroutside of the home, wear a mask. Tell your close contactsthat they may have been exposed to COVID-19. An infected person can spread COVID-19 starting 48 hours (or 2 days) before the person has any symptoms or tests positive. By letting your close contacts know they may have been exposed to COVID-19, you are helping to protect everyone. Additional guidance is available for those living in close quarters and shared housing. See COVID-19 and Animals if you have questions about pets. If you are diagnosed with COVID-19, someone from the health department may call you. Answer the call to slow the spread. Monitor your symptoms Symptoms of COVID-19 include fever, cough, or other symptoms. Follow care instructions from your healthcare provider and local health department. Your local health authorities may give instructions on checking your symptoms and reporting information. When to seek emergency medical attention Look for emergency warning signs* for COVID-19. If someone is showing any of these signs, seek emergency medical care immediately: Trouble breathing Persistent pain or pressure in the chest New confusion Inability to wake or stay awake Pale, gray, or blue-colored skin, lips, or nail beds, depending on skin tone *This list is not all possible symptoms. Please call your medical provider forany other symptoms that are severe or concerning to you. Call 911 or call ahead to your local  emergency facility: Notify the operator that you are seeking care for someone who has or may haveCOVID-19. Call ahead before visiting your doctor Call ahead. Many medical visits for routine care are being postponed or done by phone or telemedicine. If you have a medical appointment that cannot be postponed, call your doctor's office, and tell them you have or may have COVID-19. This will help the office protect themselves and other patients. Get tested If you have symptoms of COVID-19, get tested. While waiting for test results, you stay away from others, including staying apart from those living in your household. Self-tests are one of several options for testing for the virus that causes COVID-19 and may be more convenient than laboratory-based tests and point-of-care tests. Ask your healthcare provider or your local health department if you need help interpreting your test results. You  can visit your state, tribal, local, and territorial health department's website to look for the latest local information on testing sites. If you are sick, wear a mask over your nose and mouth You should wear a mask over your nose and mouth if you must be around other people or animals, including pets (even at home). You don't need to wear the mask if you are alone. If you can't put on a mask (because of trouble breathing, for example), cover your coughs and sneezes in some other way. Try to stay at least 6 feet away from other people. This will help protect the people around you. Masks should not be placed on young children under age 35 years, anyone who has trouble breathing, or anyone who is not able to remove the mask without help. Note: During the COVID-19 pandemic, medical grade facemasks are reserved forhealthcare workers and some first responders. Cover your coughs and sneezes Cover your mouth and nose with a tissue when you cough or sneeze. Throw away used tissues in a lined trash can. Immediately wash  your hands with soap and water for at least 20 seconds. If soap and water are not available, clean your hands with an alcohol-based hand sanitizer that contains at least 60% alcohol. Clean your hands often Wash your hands often with soap and water for at least 20 seconds. This is especially important after blowing your nose, coughing, or sneezing; going to the bathroom; and before eating or preparing food. Use hand sanitizer if soap and water are not available. Use an alcohol-based hand sanitizer with at least 60% alcohol, covering all surfaces of your hands and rubbing them together until they feel dry. Soap and water are the best option, especially if hands are visibly dirty. Avoid touching your eyes, nose, and mouth with unwashed hands. Handwashing Tips Avoid sharing personal household items Do not share dishes, drinking glasses, cups, eating utensils, towels, or bedding with other people in your home. Wash these items thoroughly after using them with soap and water or put in the dishwasher. Clean all "high-touch" surfaces every day Clean and disinfect high-touch surfaces in your "sick room" and bathroom; wear disposable gloves. Let someone else clean and disinfect surfaces in common areas, but you should clean your bedroom and bathroom, if possible. If a caregiver or other person needs to clean and disinfect a sick person's bedroom or bathroom, they should do so on an as-needed basis. The caregiver/other person should wear a mask and disposable gloves prior to cleaning. They should wait as long as possible after the person who is sick has used the bathroom before coming in to clean and use the bathroom. High-touch surfaces include phones, remote controls, counters, tabletops, doorknobs, bathroom fixtures, toilets, keyboards, tablets, and bedside tables. Clean and disinfect areas that may have blood, stool, or body fluids on them. Use household cleaners and disinfectants. Clean the area or item  with soap and water or another detergent if it is dirty. Then, use a household disinfectant. Be sure to follow the instructions on the label to ensure safe and effective use of the product. Many products recommend keeping the surface wet for several minutes to ensure germs are killed. Many also recommend precautions such as wearing gloves and making sure you have good ventilation during use of the product. Use a product from [employer] List N: Disinfectants for Coronavirus (COVID-19). Complete Disinfection Guidance When you can be around others after being sick with COVID-19 Deciding when you can be around others is  different for different situations. Find out when you can safely end home isolation. For any additional questions about your care,contact your healthcare provider or state or local health department. 09/17/2020 Content source: [facility] ([facility]), Division of Viral Diseases This information is not intended to replace advice given to you by your health care provider. Make sure you discuss any questions you have with your healthcare provider. Document Revised: 11/14/2020 Document Reviewed: 11/14/2020 Elsevier Patient Education  2022 [employer].   If you have been instructed to have an in-person evaluation today at a local Urgent Care facility, please use the link below. It will take you to a list of all of our available [facility], including address, phone number and hours of operation. Please do not delay care.  [facility]  If you or a family member do not have a primary care provider, use the link below to schedule a visit and establish care. When you choose a [facility] primary care physician or advanced practice provider, you gain a long-term partner in health. Find a Primary Care Provider  Learn more about [facility]'s in-office and virtual care options: [facility] - Get Care Now

## 2021-05-18 ENCOUNTER — Encounter: Payer: Self-pay | Admitting: Gastroenterology

## 2021-05-21 ENCOUNTER — Encounter: Payer: Self-pay | Admitting: Internal Medicine

## 2021-07-16 ENCOUNTER — Encounter: Payer: BC Managed Care – PPO | Admitting: Internal Medicine

## 2021-07-24 ENCOUNTER — Encounter: Payer: BC Managed Care – PPO | Admitting: Internal Medicine
